# Patient Record
Sex: Female | Born: 1990 | Race: White | Hispanic: No | Marital: Married | Smoking: Never smoker
Health system: Southern US, Community
[De-identification: ages and names within clinical notes are randomized; demographics above are authoritative.]

## PROBLEM LIST (undated history)

## (undated) ENCOUNTER — Inpatient Hospital Stay: Payer: Self-pay

## (undated) DIAGNOSIS — F419 Anxiety disorder, unspecified: Secondary | ICD-10-CM

## (undated) DIAGNOSIS — D249 Benign neoplasm of unspecified breast: Secondary | ICD-10-CM

## (undated) HISTORY — DX: Benign neoplasm of unspecified breast: D24.9

---
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
## 2011-09-02 DIAGNOSIS — D249 Benign neoplasm of unspecified breast: Secondary | ICD-10-CM

## 2011-09-02 HISTORY — PX: BREAST BIOPSY: SHX20

## 2011-09-02 HISTORY — DX: Benign neoplasm of unspecified breast: D24.9

## 2012-11-10 ENCOUNTER — Encounter: Payer: Self-pay | Admitting: *Deleted

## 2012-11-27 ENCOUNTER — Encounter: Payer: Self-pay | Admitting: General Surgery

## 2013-01-20 ENCOUNTER — Ambulatory Visit: Payer: Self-pay | Admitting: General Surgery

## 2013-01-27 ENCOUNTER — Encounter: Payer: Self-pay | Admitting: *Deleted

## 2014-07-03 ENCOUNTER — Encounter: Payer: Self-pay | Admitting: *Deleted

## 2014-08-28 ENCOUNTER — Observation Stay: Payer: Self-pay

## 2014-08-28 LAB — PIH PROFILE
Anion Gap: 8 (ref 7–16)
BUN: 8 mg/dL (ref 7–18)
CALCIUM: 8.5 mg/dL (ref 8.5–10.1)
Chloride: 108 mmol/L — ABNORMAL HIGH (ref 98–107)
Co2: 22 mmol/L (ref 21–32)
Creatinine: 0.77 mg/dL (ref 0.60–1.30)
EGFR (African American): >60
EGFR (Non-African Amer.): >60
Glucose: 92 mg/dL (ref 65–99)
HCT: 33.2 % — ABNORMAL LOW (ref 35.0–47.0)
HGB: 11 g/dL — AB (ref 12.0–16.0)
MCH: 32.2 pg (ref 26.0–34.0)
MCHC: 33.2 g/dL (ref 32.0–36.0)
MCV: 97 fL (ref 80–100)
Osmolality: 274 (ref 275–301)
PLATELETS: 279 10*3/uL (ref 150–440)
POTASSIUM: 3.7 mmol/L (ref 3.5–5.1)
RBC: 3.41 10*6/uL — ABNORMAL LOW (ref 3.80–5.20)
RDW: 12.9 % (ref 11.5–14.5)
SGOT(AST): 17 U/L (ref 15–37)
Sodium: 138 mmol/L (ref 136–145)
Uric Acid: 4.1 mg/dL (ref 2.6–6.0)
WBC: 10.9 10*3/uL (ref 3.6–11.0)

## 2014-08-28 LAB — PROTEIN / CREATININE RATIO, URINE
CREATININE, URINE: 36.6 mg/dL (ref 30.0–125.0)
PROTEIN/CREAT. RATIO: 492 mg/g{creat} — AB (ref 0–200)
Protein, Random Urine: 18 mg/dL — ABNORMAL HIGH (ref 0–12)

## 2014-08-30 ENCOUNTER — Inpatient Hospital Stay: Payer: Self-pay | Admitting: Obstetrics & Gynecology

## 2014-08-30 LAB — PIH PROFILE
ANION GAP: 7 (ref 7–16)
BUN: 9 mg/dL (ref 7–18)
CHLORIDE: 108 mmol/L — AB (ref 98–107)
Calcium, Total: 8.9 mg/dL (ref 8.5–10.1)
Co2: 25 mmol/L (ref 21–32)
Creatinine: 0.78 mg/dL (ref 0.60–1.30)
EGFR (Non-African Amer.): >60
Glucose: 73 mg/dL (ref 65–99)
HCT: 33.7 % — ABNORMAL LOW (ref 35.0–47.0)
HGB: 11.2 g/dL — ABNORMAL LOW (ref 12.0–16.0)
MCH: 31.9 pg (ref 26.0–34.0)
MCHC: 33.2 g/dL (ref 32.0–36.0)
MCV: 96 fL (ref 80–100)
Osmolality: 277 (ref 275–301)
Platelet: 277 10*3/uL (ref 150–440)
Potassium: 4 mmol/L (ref 3.5–5.1)
RBC: 3.51 10*6/uL — AB (ref 3.80–5.20)
RDW: 13.1 % (ref 11.5–14.5)
SGOT(AST): 19 U/L (ref 15–37)
SODIUM: 140 mmol/L (ref 136–145)
URIC ACID: 4.1 mg/dL (ref 2.6–6.0)
WBC: 11.4 10*3/uL — AB (ref 3.6–11.0)

## 2014-08-30 LAB — PROTEIN / CREATININE RATIO, URINE
CREATININE, URINE: 58.7 mg/dL (ref 30.0–125.0)
PROTEIN, RANDOM URINE: 23 mg/dL — AB (ref 0–12)
Protein/Creat. Ratio: 392 mg/gCREAT — ABNORMAL HIGH (ref 0–200)

## 2014-09-01 DIAGNOSIS — O1400 Mild to moderate pre-eclampsia, unspecified trimester: Secondary | ICD-10-CM

## 2014-09-01 HISTORY — DX: Mild to moderate pre-eclampsia, unspecified trimester: O14.00

## 2014-09-02 LAB — HEMATOCRIT: HCT: 33 % — ABNORMAL LOW (ref 35.0–47.0)

## 2015-01-09 NOTE — H&P (Signed)
L&D Evaluation:  History Expanded:  HPI Pt is a 24 yo G1P0 at 49w2dGA with an EDC of 09/11/14 who presents to L&D for Induction of labor for preeclampsia without severe features. Pt reports +FM, and an occassional headache though none today, denies vb, lof or ctx. Her prenatal course is has been benign. She is O+, RNI, GBS positive, VI, flu and tdap up to date.  She has had several episodes of elevated BPs to the mild range. A recent 24-hour Urine total protein with > 3018m   Presents with other, HTN   Patient's Medical History No Chronic Illness   Patient's Surgical History left breast biopsy, wisdom teeth   Medications Pre Natal Vitamins   Allergies other, seasonal, corn pops cereal,   Social History none   Family History Non-Contributory   ROS:  ROS All systems were reviewed.  HEENT, CNS, GI, GU, Respiratory, CV, Renal and Musculoskeletal systems were found to be normal.   Exam:  Vital Signs stable  Afebrile, VSS (normotensive)   General no apparent distress   Mental Status clear   Chest clear   Heart normal sinus rhythm   Abdomen gravid, non-tender   Estimated Fetal Weight Average for gestational age   Back no CVAT   Edema no edema   Pelvic no external lesions, 1/30/-3   Mebranes Intact   FHT normal rate with no decels, 135, moderate variability, +accels, no decels   Ucx irregular   Skin dry, no lesions, no rashes   Lymph no lymphadenopathy   Impression:  Impression evaluation for PIH, 1) Intrauterine pregnancy at [age]w2dstational age, 2) preeclampsia without severe features   Plan:  Plan EFM/NST   Comments 1) Labor: Induction of labor w Cervadil  2) Fetus - category I tracing  3) PNL O Positive / ABSC negative / RNI - needs MMR / VZI / HIV neg / RPR NR / HBsAg neg / 1st trimester screen negative / CF screen negative /msAFP neg / 1-hr OGTT 99 / GBS positive  4) TDAP & flu vacc's given 07/05/14  5) Disposition - home postpartum  6)  Induction of labor: Pt has been fully informed of the pros and cons, risk/benefits continued observation with NST/AFI versus the risks of induction.  She understands that there is an increasing risk to the fetus and mother with preeclampsia.  She also understands that there are uncommon risks to induction, which include but are not limited to:  frequent and/or prolonged uterine contractions, fetal distress, uterine rupture and lack of success of induction.  These risks involve all methods of induction whether Pitocin or Misoprostol She also has been informed that if the induction is not successful a Cesarean Section may be necessary.   Follow Up Appointment need to schedule   Labs:  Lab Results:  Hepatic:  30-Dec-15 16:08   SGOT (AST) 19 (Result(s) reported on 30 Aug 2014 at 04:43PM.)  Routine BB:  30-Dec-15 16:08   ABO Group + Rh Type O Positive  Antibody Screen NEGATIVE (Result(s) reported on 30 Aug 2014 at 05:25PM.)  Routine Chem:  30-Dec-15 16:08   Uric Acid, Serum 4.1 (Result(s) reported on 30 Aug 2014 at 04:43PM.)  Glucose, Serum 73  BUN 9  Sodium, Serum 140  Potassium, Serum 4.0  Chloride, Serum  108  CO2, Serum 25  Osmolality (calc) 277  Anion Gap 7  Calcium (Total), Serum 8.9  eGFR (Non-African American) >60 (eGFR values <18m50mn/1.73 m2 may be an indication of chronic kidney disease (  CKD). Calculated eGFR, using the MRDR Study equation, is useful in  patients with stable renal function. The eGFR calculation will not be reliable in acutely ill patients when serum creatinine is changing rapidly. It is not useful in patients on dialysis. The eGFR calculation may not be applicable to patients at the low and high extremes of body sizes, pregnant women, and vegetarians.)  eGFR (African American) >60  Routine Hem:  30-Dec-15 16:08   WBC (CBC)  11.4  RBC (CBC)  3.51  Hemoglobin (CBC)  11.2  Hematocrit (CBC)  33.7  Platelet Count (CBC) 277 (Result(s) reported on 30 Aug 2014  at 04:43PM.)  MCV 96  MCH 31.9  MCHC 33.2  RDW 13.1   Electronic Signatures: Will Bonnet (MD)  (Signed 30-Dec-15 18:15)  Authored: L&D Evaluation, Labs   Last Updated: 30-Dec-15 18:15 by Will Bonnet (MD)

## 2015-01-09 NOTE — H&P (Signed)
L&D Evaluation:  History:  HPI Pt is a 24 yo G1P0 at [redacted] weeks GA with an EDC of 09/11/14 who presents to L&D after being sent over from the office with HTN of BPs with diastolics in the 33'A x2. Pt reports +FM, and an occassional headache, denies vb, lof or ctx. Her prenatal course is has been benign. She is O+, RNI, GBS positive, VI, flu and tdap up to date.   Presents with other, HTN   Patient's Medical History No Chronic Illness   Patient's Surgical History left breast biopsy, wisdom teeth   Medications Pre Natal Vitamins   Allergies other, seasonal, corn pops cereal,   Social History none   Family History Non-Contributory   ROS:  ROS All systems were reviewed.  HEENT, CNS, GI, GU, Respiratory, CV, Renal and Musculoskeletal systems were found to be normal.   Exam:  Vital Signs stable  120-133/ 77-89   General no apparent distress   Mental Status clear   Chest clear   Heart normal sinus rhythm   Abdomen gravid, non-tender   Pelvic no external lesions   Mebranes Intact   FHT normal rate with no decels, 130s, moderate variability, +accels, no decels   Ucx absent   Skin dry, no lesions, no rashes   Lymph no lymphadenopathy   Impression:  Impression evaluation for PIH, IUP at 38 weeks   Plan:  Plan EFM/NST, [place] panel   Follow Up Appointment need to schedule   Electronic Signatures: Louisa Second (CNM)  (Signed 28-Dec-15 14:35)  Authored: L&D Evaluation   Last Updated: 28-Dec-15 14:35 by Louisa Second (CNM)

## 2016-11-22 DIAGNOSIS — R0989 Other specified symptoms and signs involving the circulatory and respiratory systems: Secondary | ICD-10-CM | POA: Diagnosis not present

## 2016-11-22 DIAGNOSIS — S2341XA Sprain of ribs, initial encounter: Secondary | ICD-10-CM | POA: Diagnosis not present

## 2017-01-14 ENCOUNTER — Other Ambulatory Visit: Payer: Self-pay | Admitting: Obstetrics and Gynecology

## 2017-01-14 ENCOUNTER — Telehealth: Payer: Self-pay | Admitting: Obstetrics and Gynecology

## 2017-01-14 DIAGNOSIS — R635 Abnormal weight gain: Secondary | ICD-10-CM | POA: Insufficient documentation

## 2017-01-14 DIAGNOSIS — R5383 Other fatigue: Secondary | ICD-10-CM | POA: Insufficient documentation

## 2017-01-14 NOTE — Telephone Encounter (Signed)
Candace Higgins, This is the patient that I was talking to you about.   She would like to have labs before she comes in on June 11th to see you..  She is having Fatigue, weight gain, low sex drive.   She would like to have her thyroid checked.  Thank you

## 2017-01-14 NOTE — Telephone Encounter (Signed)
Orders in computer

## 2017-01-22 ENCOUNTER — Other Ambulatory Visit: Payer: 59

## 2017-01-22 DIAGNOSIS — R5383 Other fatigue: Secondary | ICD-10-CM | POA: Diagnosis not present

## 2017-01-22 DIAGNOSIS — R635 Abnormal weight gain: Secondary | ICD-10-CM

## 2017-01-23 LAB — COMPREHENSIVE METABOLIC PANEL
A/G RATIO: 1.7 (ref 1.2–2.2)
ALK PHOS: 65 IU/L (ref 39–117)
ALT: 11 IU/L (ref 0–32)
AST: 15 IU/L (ref 0–40)
Albumin: 4.5 g/dL (ref 3.5–5.5)
BUN/Creatinine Ratio: 12 (ref 9–23)
BUN: 10 mg/dL (ref 6–20)
Bilirubin Total: 0.9 mg/dL (ref 0.0–1.2)
CHLORIDE: 101 mmol/L (ref 96–106)
CO2: 24 mmol/L (ref 18–29)
Calcium: 9.5 mg/dL (ref 8.7–10.2)
Creatinine, Ser: 0.83 mg/dL (ref 0.57–1.00)
GFR calc Af Amer: 113 mL/min/{1.73_m2} (ref >59)
GFR calc non Af Amer: 98 mL/min/{1.73_m2} (ref >59)
Globulin, Total: 2.7 g/dL (ref 1.5–4.5)
Glucose: 82 mg/dL (ref 65–99)
POTASSIUM: 3.9 mmol/L (ref 3.5–5.2)
SODIUM: 137 mmol/L (ref 134–144)
Total Protein: 7.2 g/dL (ref 6.0–8.5)

## 2017-01-23 LAB — TSH+FREE T4
Free T4: 1.14 ng/dL (ref 0.82–1.77)
TSH: 1.45 u[IU]/mL (ref 0.450–4.500)

## 2017-02-09 ENCOUNTER — Ambulatory Visit: Payer: Self-pay | Admitting: Obstetrics and Gynecology

## 2017-03-24 ENCOUNTER — Other Ambulatory Visit (INDEPENDENT_AMBULATORY_CARE_PROVIDER_SITE_OTHER): Payer: 59

## 2017-03-24 ENCOUNTER — Ambulatory Visit (INDEPENDENT_AMBULATORY_CARE_PROVIDER_SITE_OTHER): Payer: 59 | Admitting: Obstetrics and Gynecology

## 2017-03-24 ENCOUNTER — Encounter: Payer: Self-pay | Admitting: Obstetrics and Gynecology

## 2017-03-24 VITALS — BP 118/78 | Ht 66.0 in | Wt 160.0 lb

## 2017-03-24 DIAGNOSIS — R102 Pelvic and perineal pain: Secondary | ICD-10-CM

## 2017-03-24 DIAGNOSIS — T8332XA Displacement of intrauterine contraceptive device, initial encounter: Secondary | ICD-10-CM

## 2017-03-24 DIAGNOSIS — N9419 Other specified dyspareunia: Secondary | ICD-10-CM | POA: Diagnosis not present

## 2017-03-24 DIAGNOSIS — N3001 Acute cystitis with hematuria: Secondary | ICD-10-CM | POA: Diagnosis not present

## 2017-03-24 LAB — POCT URINALYSIS DIPSTICK
Bilirubin, UA: NEGATIVE
Glucose, UA: NEGATIVE
KETONES UA: NEGATIVE
NITRITE UA: NEGATIVE
PROTEIN UA: NEGATIVE
Spec Grav, UA: 1.02 (ref 1.010–1.025)
UROBILINOGEN UA: NEGATIVE U/dL
pH, UA: 6 (ref 5.0–8.0)

## 2017-03-24 MED ORDER — CIPROFLOXACIN HCL 500 MG PO TABS: 500.0000 mg | ORAL_TABLET | Freq: Two times a day (BID) | ORAL | 0 refills | Status: DC

## 2017-03-24 NOTE — Progress Notes (Addendum)
Chief Complaint  Patient presents with  . IUD check  . Follow-up  . Pelvic Pain    HPI:      [name] is a 26 y.o. G1P1001 who LMP was No LMP recorded. Patient is not currently having periods (Reason: IUD)., presents today for cramping, pelvic pain and nausea for the past 2-3 days. She also noticed fevers/chills yesterday, LT LBP, dysuria, and vaginal bleeding yesterday, resolved today. She took advil last night with a little improvement, but she hasn't had any relief today with NSAIDs. She has a Mirena, placed 3/16. She notices postcoital dark red spotting after sex for the past month, but has had dyspareunia for the past yr with IUD. No increased d/c, irritation, odor recently. No urin frequency/urgency/hematuria. She does not have a period with IUD, no spotting/cramping usually.   She tried to feel her IUD strings last night but couldn't find them. She had a neg UPT yesterday.  Patient Active Problem List   Diagnosis Date Noted  . Fatigue 01/14/2017  . Weight gain 01/14/2017    Family History  Problem Relation Age of Onset  . Hypertension Father   . Hypertension Paternal Grandfather     Social History   Social History  . Marital status: Single    Spouse name: N/A  . Number of children: N/A  . Years of education: N/A   Occupational History  . Not on file.   Social History Main Topics  . Smoking status: Never Smoker  . Smokeless tobacco: Never Used  . Alcohol use No  . Drug use: No  . Sexual activity: Yes    Birth control/ protection: IUD   Other Topics Concern  . Not on file   Social History Narrative  . No narrative on file     Current Outpatient Prescriptions:  .  levonorgestrel (MIRENA) 20 MCG/24HR IUD, 1 each by Intrauterine route once., Disp: , Rfl:  .  ciprofloxacin (CIPRO) 500 MG tablet, Take 1 tablet (500 mg total) by mouth 2 (two) times daily., Disp: 6 tablet, Rfl: 0  Review of Systems  Constitutional: Positive for fatigue. Negative  for fever.  Gastrointestinal: Positive for nausea. Negative for blood in stool, constipation, diarrhea and vomiting.  Genitourinary: Positive for dysuria, pelvic pain and vaginal bleeding. Negative for dyspareunia, flank pain, frequency, hematuria, urgency, vaginal discharge and vaginal pain.  Musculoskeletal: Positive for back pain.  Skin: Negative for rash.     OBJECTIVE:   Vitals:  BP 118/78   Ht 5\' 6"  (1.676 m)   Wt 160 lb (72.6 kg)   BMI 25.82 kg/m   Physical Exam  Constitutional: She is oriented to person, place, and time and well-developed, well-nourished, and in no distress.  Abdominal: Soft. Normal appearance. There is tenderness in the right lower quadrant, suprapubic area and left lower quadrant. There is CVA tenderness. There is no rebound and no guarding.  Genitourinary: Vulva normal. Uterus is tender. Uterus is not enlarged.  Cervix is not fixed. Cervix exhibits tenderness. Cervix exhibits no motion tenderness and no lesion. Right adnexum displays tenderness. Right adnexum displays no deviation and no mass. Left adnexum displays tenderness. Left adnexum displays no deviation and no mass. Vagina exhibits no lesion. Vaginal discharge found.  Genitourinary Comments: CX: IUD STRINGS NOT VISIBLE NO BLEEDING IN VAGINA/CX  Neurological: She is alert and oriented to person, place, and time.  Psychiatric: Affect and judgment normal.  Vitals reviewed.   Results: Results for orders placed or performed in visit  on 03/24/17 (from the past 24 hour(s))  POCT Urinalysis Dipstick     Status: Abnormal   Collection Time: 03/24/17 11:56 AM  Result Value Ref Range   Color, UA yellow    Clarity, UA     Glucose, UA neg    Bilirubin, UA neg    Ketones, UA neg    Spec Grav, UA 1.020 1.010 - 1.025   Blood, UA small    pH, UA 6.0 5.0 - 8.0   Protein, UA neg    Urobilinogen, UA negative 0.2 or 1.0 E.U./dL   Nitrite, UA neg    Leukocytes, UA Small (1+) (A) Negative    GYN  U/S-->EM=1.65 MM; IUD IN PLACE; BILAT FOLLICLES; NO FF IN CDS   Assessment/Plan: Pelvic pain - Neg GYN u/s. IUD in place. Question due to UTI. Rx cipro. If persists, could be related to IUD--pt may want removed anyway due to dyspareunia. F/u wtih MD prn. - Plan: US Transvaginal Non-OB, POCT Urinalysis Dipstick  Intrauterine contraceptive device threads lost, initial encounter - Plan: US Transvaginal Non-OB  Acute cystitis with hematuria - Rx cipro. Check C&S. F/u prn. - Plan: ciprofloxacin (CIPRO) 500 MG tablet, Urine Culture  Dyspareunia due to medical condition in female patient - Even though IUD in correct place, could be related to IUD. Pt to f/u with MD for removal if desires.   Return if symptoms worsen or fail to improve.  Becky Berberian B. Jonavin Seder, PA-C 03/24/2017 1:57 PM

## 2017-03-25 ENCOUNTER — Other Ambulatory Visit: Payer: 59

## 2017-03-28 ENCOUNTER — Emergency Department
Admission: EM | Admit: 2017-03-28 | Discharge: 2017-03-28 | Disposition: A | Discharge status: Home / Self Care | Payer: 59 | Attending: Emergency Medicine | Admitting: Emergency Medicine

## 2017-03-28 ENCOUNTER — Emergency Department: Payer: 59

## 2017-03-28 ENCOUNTER — Encounter: Payer: Self-pay | Admitting: Emergency Medicine

## 2017-03-28 DIAGNOSIS — R0600 Dyspnea, unspecified: Secondary | ICD-10-CM | POA: Diagnosis not present

## 2017-03-28 DIAGNOSIS — R079 Chest pain, unspecified: Secondary | ICD-10-CM | POA: Diagnosis present

## 2017-03-28 DIAGNOSIS — K2900 Acute gastritis without bleeding: Secondary | ICD-10-CM | POA: Diagnosis not present

## 2017-03-28 DIAGNOSIS — Z79899 Other long term (current) drug therapy: Secondary | ICD-10-CM | POA: Insufficient documentation

## 2017-03-28 DIAGNOSIS — R0789 Other chest pain: Secondary | ICD-10-CM | POA: Diagnosis not present

## 2017-03-28 LAB — HEPATIC FUNCTION PANEL
ALT: 43 U/L (ref 14–54)
AST: 32 U/L (ref 15–41)
Albumin: 4.5 g/dL (ref 3.5–5.0)
Alkaline Phosphatase: 70 U/L (ref 38–126)
BILIRUBIN DIRECT: 0.2 mg/dL (ref 0.1–0.5)
BILIRUBIN INDIRECT: 1.1 mg/dL — AB (ref 0.3–0.9)
TOTAL PROTEIN: 8.1 g/dL (ref 6.5–8.1)
Total Bilirubin: 1.3 mg/dL — ABNORMAL HIGH (ref 0.3–1.2)

## 2017-03-28 LAB — CBC WITH DIFFERENTIAL/PLATELET
BASOS ABS: 0 10*3/uL (ref 0–0.1)
BASOS PCT: 1 %
EOS ABS: 0 10*3/uL (ref 0–0.7)
Eosinophils Relative: 0 %
HEMATOCRIT: 41.4 % (ref 35.0–47.0)
HEMOGLOBIN: 14.5 g/dL (ref 12.0–16.0)
Lymphocytes Relative: 23 %
Lymphs Abs: 1.6 10*3/uL (ref 1.0–3.6)
MCH: 32.3 pg (ref 26.0–34.0)
MCHC: 35 g/dL (ref 32.0–36.0)
MCV: 92.5 fL (ref 80.0–100.0)
Monocytes Absolute: 0.7 10*3/uL (ref 0.2–0.9)
Monocytes Relative: 10 %
NEUTROS ABS: 4.8 10*3/uL (ref 1.4–6.5)
NEUTROS PCT: 66 %
Platelets: 200 10*3/uL (ref 150–440)
RBC: 4.48 MIL/uL (ref 3.80–5.20)
RDW: 12.6 % (ref 11.5–14.5)
WBC: 7.2 10*3/uL (ref 3.6–11.0)

## 2017-03-28 LAB — BASIC METABOLIC PANEL
ANION GAP: 9 (ref 5–15)
BUN: 12 mg/dL (ref 6–20)
CALCIUM: 9.5 mg/dL (ref 8.9–10.3)
CHLORIDE: 103 mmol/L (ref 101–111)
CO2: 25 mmol/L (ref 22–32)
CREATININE: 0.97 mg/dL (ref 0.44–1.00)
GFR calc non Af Amer: >60 mL/min (ref >60)
Glucose, Bld: 118 mg/dL — ABNORMAL HIGH (ref 65–99)
Potassium: 4 mmol/L (ref 3.5–5.1)
SODIUM: 137 mmol/L (ref 135–145)

## 2017-03-28 LAB — TROPONIN I

## 2017-03-28 LAB — FIBRIN DERIVATIVES D-DIMER (ARMC ONLY): FIBRIN DERIVATIVES D-DIMER (ARMC): 476.63 (ref 0.00–499.00)

## 2017-03-28 LAB — LIPASE, BLOOD: Lipase: 21 U/L (ref 11–51)

## 2017-03-28 MED ORDER — GI COCKTAIL ~~LOC~~
30.0000 mL | Freq: Once | ORAL | Status: AC
  Administered 2017-03-28: 30 mL via ORAL
  Filled 2017-03-28: qty 30

## 2017-03-28 MED ORDER — FAMOTIDINE IN NACL 20-0.9 MG/50ML-% IV SOLN
20.0000 mg | Freq: Once | INTRAVENOUS | Status: AC
  Administered 2017-03-28: 20 mg via INTRAVENOUS
  Filled 2017-03-28: qty 50

## 2017-03-28 MED ORDER — SODIUM CHLORIDE 0.9 % IV BOLUS (SEPSIS)
1000.0000 mL | Freq: Once | INTRAVENOUS | Status: AC
  Administered 2017-03-28: 1000 mL via INTRAVENOUS

## 2017-03-28 MED ORDER — SUCRALFATE 1 G PO TABS: 1.0000 g | ORAL_TABLET | Freq: Four times a day (QID) | ORAL | 0 refills | Status: DC

## 2017-03-28 MED ORDER — OMEPRAZOLE 40 MG PO CPDR: 40.0000 mg | DELAYED_RELEASE_CAPSULE | Freq: Every day | ORAL | 0 refills | Status: DC

## 2017-03-28 MED ORDER — ALBUTEROL SULFATE (2.5 MG/3ML) 0.083% IN NEBU
5.0000 mg | INHALATION_SOLUTION | Freq: Once | RESPIRATORY_TRACT | Status: DC
  Filled 2017-03-28: qty 6

## 2017-03-28 NOTE — ED Triage Notes (Signed)
Pt arrives ambulatory to triage with c/o SOB. Pt states that she had what she believed was acid reflux yesterday and it is still unrelieved. Pt reports that she is now having difficulty catching her breath. Pt is in NAD at this time.

## 2017-03-28 NOTE — ED Provider Notes (Signed)
[facility] Emergency Department Provider Note  ____________________________________________   First MD Initiated Contact with Patient 03/28/17 [phone]     (approximate)  I have reviewed the triage vital signs and the nursing notes.   HISTORY  Chief Complaint Respiratory Distress   HPI Candace Higgins is a 26 y.o. female without any chronic medical problems was presenting to the emergency department today with upper abdominal as well as lower chest pain for the past 24 hours. Says the pain is 9 out of 10 and is a burning and stabbing pain. The patient says that it is worse when she lays down. She took Tums before going to bed which improved the pain with that she woke up in the middle the night with the air out of 10 pain which made her alarmed to come to the hospital. She denies any hormone supplementation except for her IUD. No history of blood clots in the family. Denies any productive cough.   Past Medical History:  Diagnosis Date  . Benign neoplasm of breast 2013    Patient Active Problem List   Diagnosis Date Noted  . Fatigue 01/14/2017  . Weight gain 01/14/2017    Past Surgical History:  Procedure Laterality Date  . BREAST BIOPSY  2013   left  . [place] EXTRACTION  2009    Prior to Admission medications   Medication Sig Start Date End Date Taking? Authorizing Provider  ciprofloxacin (CIPRO) 500 MG tablet Take 1 tablet (500 mg total) by mouth 2 (two) times daily. 3/32/95   Copland, Deirdre Evener, PA-C  levonorgestrel (MIRENA) 20 MCG/24HR IUD 1 each by Intrauterine route once.    [provider]    Allergies Patient has no known allergies.  Family History  Problem Relation Age of Onset  . Hypertension Father   . Hypertension Paternal Grandfather     Social History Social History  Substance Use Topics  . Smoking status: Never Smoker  . Smokeless tobacco: Never Used  . Alcohol use No    Review of  Systems  Constitutional: No fever/chills Eyes: No visual changes. ENT: No sore throat. Cardiovascular: as above Respiratory: Denies shortness of breath. Gastrointestinal: No abdominal pain.  No nausea, no vomiting.  No diarrhea.  No constipation. Genitourinary: Negative for dysuria. Musculoskeletal: Negative for back pain. Skin: Negative for rash. Neurological: Negative for headaches, focal weakness or numbness.   ____________________________________________   PHYSICAL EXAM:  VITAL SIGNS: ED Triage Vitals  Enc Vitals Group     BP 03/28/17 0536 136/78     Pulse Rate 03/28/17 0536 (!) 110     Resp 03/28/17 0536 12     Temp 03/28/17 0536 98.4 F (36.9 C)     Temp Source 03/28/17 0536 Oral     SpO2 03/28/17 0536 98 %     Weight 03/28/17 0532 160 lb (72.6 kg)     Height 03/28/17 0532 5\' 6"  (1.676 m)     Head Circumference --      Peak Flow --      Pain Score 03/28/17 0532 8     Pain Loc --      Pain Edu? --      Excl. in [place]? --     Constitutional: Alert and oriented. Well appearing and in no acute distress. Eyes: Conjunctivae are normal.  Head: Atraumatic. Nose: No congestion/rhinnorhea. Mouth/Throat: Mucous membranes are moist.  Neck: No stridor.   Cardiovascular: Normal rate, regular rhythm. Grossly normal heart sounds.  Good  peripheral circulation. Respiratory: Normal respiratory effort.  No retractions. Lungs CTAB. Gastrointestinal: Soft With epigastric tenderness palpation. No distention.  Musculoskeletal: No lower extremity tenderness nor edema.  No joint effusions. Neurologic:  Normal speech and language. No gross focal neurologic deficits are appreciated. Skin:  Skin is warm, dry and intact. No rash noted. Psychiatric: Mood and affect are normal. Speech and behavior are normal.  ____________________________________________   LABS (all labs ordered are listed, but only abnormal results are displayed)  Labs Reviewed  BASIC METABOLIC PANEL - Abnormal;  Notable for the following:       Result Value   Glucose, Bld 118 (*)    All other components within normal limits  HEPATIC FUNCTION PANEL - Abnormal; Notable for the following:    Total Bilirubin 1.3 (*)    Indirect Bilirubin 1.1 (*)    All other components within normal limits  CBC WITH DIFFERENTIAL/PLATELET  TROPONIN I  FIBRIN DERIVATIVES D-DIMER ([facility] ONLY)  LIPASE, BLOOD   ____________________________________________  EKG  ED ECG REPORT I, Schaevitz,  Youlanda Roys, the attending physician, personally viewed and interpreted this ECG.   Date: 03/28/2017  EKG Time: 0537  Rate: 97  Rhythm: normal sinus rhythm  Axis: Normal  Intervals:none  ST&T Change: No ST segment elevation or depression. No abnormal T-wave inversion.  ____________________________________________  RADIOLOGY  No acute cardiopulmonary disease on chest x-ray ____________________________________________   PROCEDURES  Procedure(s) performed:   Procedures  Critical Care performed:   ____________________________________________   INITIAL IMPRESSION / ASSESSMENT AND PLAN / ED COURSE  Pertinent labs & imaging results that were available during my care of the patient were reviewed by me and considered in my medical decision making (see chart for details).   ----------------------------------------- 6:55 AM on 03/28/2017 -----------------------------------------  Patient says that the GI cocktail has relieved all of the burning in her chest. However, she is still with a discomfort which feels like a heaviness between her breasts. She says that it hurts when she pushes on that area as well. Very reassuring workup including a negative d-dimer and troponin. Heart rate now is down in the 80s. Patient appears noticeably calmer. Very low risk for ACS as well as PE. GI cocktail caused significant improvement. Discussed likely diagnosis of gastritis and reflux disease with the patient. She'll be discharged with  omeprazole as well as sucralfate. She'll be following up in the office. She is understanding of the diagnosis as well as the treatment plan and willing to comply.      ____________________________________________   FINAL CLINICAL IMPRESSION(S) / ED DIAGNOSES  Gastritis. Chest pain.    NEW MEDICATIONS STARTED DURING THIS VISIT:  New Prescriptions   No medications on file     Note:  This document was prepared using Dragon voice recognition software and may include unintentional dictation errors.     Orbie Pyo, MD 03/28/17 [phone]

## 2017-04-03 ENCOUNTER — Ambulatory Visit (INDEPENDENT_AMBULATORY_CARE_PROVIDER_SITE_OTHER): Payer: 59 | Admitting: Obstetrics and Gynecology

## 2017-04-03 VITALS — BP 122/70 | Ht 66.0 in | Wt 158.0 lb

## 2017-04-03 DIAGNOSIS — Z30432 Encounter for removal of intrauterine contraceptive device: Secondary | ICD-10-CM

## 2017-04-03 DIAGNOSIS — Z308 Encounter for other contraceptive management: Secondary | ICD-10-CM

## 2017-04-03 MED ORDER — NORGESTIM-ETH ESTRAD TRIPHASIC 0.18/0.215/0.25 MG-25 MCG PO TABS: 1.0000 | ORAL_TABLET | Freq: Every day | ORAL | 4 refills | Status: DC

## 2017-04-03 NOTE — Progress Notes (Signed)
   IUD Removal  Patient identified, informed consent performed, consent signed.  Patient was in the dorsal lithotomy position, normal external genitalia was noted.  A speculum was placed in the patient's vagina, normal discharge was noted, no lesions. The cervix was visualized, no lesions, no abnormal discharge.  The strings of the IUD were grasped and pulled using ring forceps. The IUD was removed in its entirety. Patient tolerated the procedure well.    Patient will use combined OCPs for contraception.  Routine preventative health maintenance measures emphasized.  Prentice Docker, MD 04/03/2017 5:07 PM

## 2017-06-16 ENCOUNTER — Ambulatory Visit (INDEPENDENT_AMBULATORY_CARE_PROVIDER_SITE_OTHER): Payer: 59

## 2017-06-16 ENCOUNTER — Ambulatory Visit (INDEPENDENT_AMBULATORY_CARE_PROVIDER_SITE_OTHER): Payer: 59 | Admitting: Obstetrics & Gynecology

## 2017-06-16 ENCOUNTER — Encounter: Payer: Self-pay | Admitting: Obstetrics & Gynecology

## 2017-06-16 VITALS — BP 100/60 | Wt 154.0 lb

## 2017-06-16 DIAGNOSIS — Z124 Encounter for screening for malignant neoplasm of cervix: Secondary | ICD-10-CM

## 2017-06-16 DIAGNOSIS — N915 Oligomenorrhea, unspecified: Secondary | ICD-10-CM

## 2017-06-16 DIAGNOSIS — Z3201 Encounter for pregnancy test, result positive: Secondary | ICD-10-CM

## 2017-06-16 LAB — POCT URINE PREGNANCY: PREG TEST UR: POSITIVE — AB

## 2017-06-16 NOTE — Addendum Note (Signed)
Addended by: Quintella Baton D on: 06/16/2017 10:46 AM   Modules accepted: Orders

## 2017-06-16 NOTE — Progress Notes (Signed)
06/16/2017   Chief Complaint: Missed period  Transfer of Care Patient: no  History of Present Illness: Ms. Strahm is a 26 y.o. G2P1001 Unknown based on No LMP recorded. Patient is pregnant. with an Estimated Date of Delivery: None noted., with the above CC. IUD removed and no period since (04/03/17)  Her periods were: absent She was using oral contraceptives (estrogen/progesterone) when she conceived. No period yet on pills.  Stopped when had pos uPT. She has Positive signs or symptoms of breast T of pregnancy. She has Negative signs or symptoms of miscarriage or preterm labor She identifies Negative Zika risk factors for her and her partner On any different medications around the time she conceived/early pregnancy: No  History of varicella: Yes   ROS: A 12-point review of systems was performed and negative, except as stated in the above HPI.  OBGYN History: As per HPI. OB History  Gravida Para Term Preterm AB Living  2 1 1     1   SAB TAB Ectopic Multiple Live Births          1    # Outcome Date GA Lbr Len/2nd Weight Sex Delivery Anes PTL Lv  2 Current           1 Term             Obstetric Comments  First menstrual period age 46    Any issues with any prior pregnancies: PRECLAMPSIA Any prior children are healthy, doing well, without any problems or issues: yes History of pap smears: Yes. Last pap smear 2017. Abnormal: no  History of STIs: No   Past Medical History: Past Medical History:  Diagnosis Date  . Benign neoplasm of breast 2013    Past Surgical History: Past Surgical History:  Procedure Laterality Date  . BREAST BIOPSY  2013   left  . WISDOM TOOTH EXTRACTION  2009    Family History:  Family History  Problem Relation Age of Onset  . Hypertension Father   . Hypertension Paternal Grandfather    She denies any female cancers, bleeding or blood clotting disorders.  She denies any history of mental retardation, birth defects or genetic disorders in her or  the FOB's history  Social History:  Social History   Social History  . Marital status: Single    Spouse name: N/A  . Number of children: N/A  . Years of education: N/A   Occupational History  . Not on file.   Social History Main Topics  . Smoking status: Never Smoker  . Smokeless tobacco: Never Used  . Alcohol use No  . Drug use: No  . Sexual activity: Yes    Birth control/ protection: IUD   Other Topics Concern  . Not on file   Social History Narrative  . No narrative on file  Any pets in the household: no  Allergy: No Known Allergies  Current Outpatient Medications:  Current Outpatient Prescriptions:  .  omeprazole (PRILOSEC) 40 MG capsule, Take 1 capsule (40 mg total) by mouth daily. (Patient not taking: Reported on 06/16/2017), Disp: 30 capsule, Rfl: 0  Physical Exam:   BP 100/60   Wt 154 lb (69.9 kg)   BMI 24.86 kg/m  Body mass index is 24.86 kg/m. Constitutional: Well nourished, well developed female in no acute distress.  Neck:  Supple, normal appearance, and no thyromegaly  Cardiovascular: S1, S2 normal, no murmur, rub or gallop, regular rate and rhythm Respiratory:  Clear to auscultation bilateral. Normal respiratory effort Abdomen:  positive bowel sounds and no masses, hernias; diffusely non tender to palpation, non distended Breasts: breasts appear normal, no suspicious masses, no skin or nipple changes or axillary nodes. Neuro/Psych:  Normal mood and affect.  Skin:  Warm and dry.  Lymphatic:  No inguinal lymphadenopathy.   Pelvic exam: is not limited by body habitus EGBUS: within normal limits, Vagina: within normal limits and with no blood in the vault, Cervix: normal appearing cervix without discharge or lesions, closed/long/high, Uterus:  enlarged: 6 weeks, and Adnexa:  normal adnexa  Assessment: Ms. Brittle is a 26 y.o. [id number] with uncertain EGA based on oligomenorrhea at time of conception, for prenatal care. H/o Preclamspia  Plan:  1) Avoid  alcoholic beverages. 2) Patient encouraged not to smoke.  3) Discontinue the use of all non-medicinal drugs and chemicals.  4) Take prenatal vitamins daily.  5) Seatbelt use advised 6) Nutrition, food safety (fish, cheese advisories, and high nitrite foods) and exercise discussed. 7) Hospital and practice style delivering at [facility] discussed  8) Patient is asked about travel to areas at risk for the [place] virus, and counseled to avoid travel and exposure to mosquitoes or sexual partners who may have themselves been exposed to the virus. Testing is discussed, and will be ordered as appropriate.  9) Childbirth classes at [facility] advised 10) Genetic Screening, such as with 1st Trimester Screening, cell free fetal DNA, AFP testing, and Ultrasound, as well as with amniocentesis and CVS as appropriate, is discussed with patient. She plans to discuss w fiancee about genetic testing this pregnancy. 11) [place] today, with labs as well 12) baby ASA daily, baseline preeclampsia labs  Problem list reviewed and updated.  Barnett Applebaum, MD, [facility], [facility] 06/16/2017  9:23 AM  Addendum: Review of ULTRASOUND.    I have personally reviewed images and report of recent ultrasound done at [facility].    Plan of management to be discussed with patient.  Barnett Applebaum, MD, [facility], [facility] 06/16/2017  10:29 AM

## 2017-06-16 NOTE — Patient Instructions (Signed)

## 2017-06-17 LAB — RPR+RH+ABO+RUB AB+AB SCR+CB...
ANTIBODY SCREEN: NEGATIVE
HIV Screen 4th Generation wRfx: NONREACTIVE
Hematocrit: 40.1 % (ref 34.0–46.6)
Hemoglobin: 13.4 g/dL (ref 11.1–15.9)
Hepatitis B Surface Ag: NEGATIVE
MCH: 32.1 pg (ref 26.6–33.0)
MCHC: 33.4 g/dL (ref 31.5–35.7)
MCV: 96 fL (ref 79–97)
PLATELETS: 254 10*3/uL (ref 150–379)
RBC: 4.18 x10E6/uL (ref 3.77–5.28)
RDW: 13.4 % (ref 12.3–15.4)
RPR Ser Ql: NONREACTIVE
Rh Factor: POSITIVE
VARICELLA: 798 {index} (ref >165)
WBC: 6.5 10*3/uL (ref 3.4–10.8)

## 2017-06-17 LAB — CMP AND LIVER
ALBUMIN: 4.2 g/dL (ref 3.5–5.5)
ALT: 28 IU/L (ref 0–32)
AST: 25 IU/L (ref 0–40)
Alkaline Phosphatase: 56 IU/L (ref 39–117)
BUN: 8 mg/dL (ref 6–20)
Bilirubin Total: 0.9 mg/dL (ref 0.0–1.2)
Bilirubin, Direct: 0.22 mg/dL (ref 0.00–0.40)
CO2: 18 mmol/L — ABNORMAL LOW (ref 20–29)
CREATININE: 0.78 mg/dL (ref 0.57–1.00)
Calcium: 9 mg/dL (ref 8.7–10.2)
Chloride: 100 mmol/L (ref 96–106)
GFR calc Af Amer: 120 mL/min/{1.73_m2} (ref >59)
GFR, EST NON AFRICAN AMERICAN: 105 mL/min/{1.73_m2} (ref >59)
GLUCOSE: 77 mg/dL (ref 65–99)
POTASSIUM: 4.6 mmol/L (ref 3.5–5.2)
Sodium: 135 mmol/L (ref 134–144)
Total Protein: 6.9 g/dL (ref 6.0–8.5)

## 2017-06-17 LAB — IGP,CTNGTV,RFX APTIMA HPV ASCU
Chlamydia, Nuc. Acid Amp: NEGATIVE
Gonococcus, Nuc. Acid Amp: NEGATIVE
PAP Smear Comment: 0
Trich vag by NAA: NEGATIVE

## 2017-06-17 LAB — PROTEIN / CREATININE RATIO, URINE
CREATININE, UR: 220.7 mg/dL
PROTEIN UR: 13.9 mg/dL
PROTEIN/CREAT RATIO: 63 mg/g{creat} (ref 0–200)

## 2017-07-14 ENCOUNTER — Ambulatory Visit (INDEPENDENT_AMBULATORY_CARE_PROVIDER_SITE_OTHER): Payer: 59 | Admitting: Obstetrics and Gynecology

## 2017-07-14 VITALS — BP 110/68 | Wt 157.0 lb

## 2017-07-14 DIAGNOSIS — O09299 Supervision of pregnancy with other poor reproductive or obstetric history, unspecified trimester: Secondary | ICD-10-CM

## 2017-07-14 DIAGNOSIS — Z1379 Encounter for other screening for genetic and chromosomal anomalies: Secondary | ICD-10-CM

## 2017-07-14 DIAGNOSIS — Z3480 Encounter for supervision of other normal pregnancy, unspecified trimester: Secondary | ICD-10-CM

## 2017-07-14 DIAGNOSIS — Z3A10 10 weeks gestation of pregnancy: Secondary | ICD-10-CM

## 2017-07-14 NOTE — Progress Notes (Signed)
    Routine Prenatal Care Visit  Subjective  Candace Higgins is a 26 y.o. G2P1001 at [redacted]w[redacted]d being seen today for ongoing prenatal care.  She is currently monitored for the following issues for this low-risk pregnancy and has Supervision of other normal pregnancy, antepartum and Hx of preeclampsia, prior pregnancy, currently pregnant on their problem list.  ----------------------------------------------------------------------------------- Patient reports no complaints.    . Vag. Bleeding: None.  Movement: Absent. Denies leaking of fluid.  ----------------------------------------------------------------------------------- The following portions of the patient's history were reviewed and updated as appropriate: allergies, current medications, past family history, past medical history, past social history, past surgical history and problem list. Problem list updated.   Objective  Blood pressure 110/68, weight 157 lb (71.2 kg). Pregravid weight Pregravid weight not on file Total Weight Gain Not found.   Urinalysis:      Fetal Status: Fetal Heart Rate (bpm): 170   Movement: Absent     General:  Alert, oriented and cooperative. Patient is in no acute distress.  Skin: Skin is warm and dry. No rash noted.   Cardiovascular: Normal heart rate noted  Respiratory: Normal respiratory effort, no problems with respiration noted  Abdomen: Soft, gravid, appropriate for gestational age. Pain/Pressure: Absent     Pelvic:  Cervical exam deferred        Extremities: Normal range of motion.     ental Status: Normal mood and affect. Normal behavior. Normal judgment and thought content.     Assessment   26 y.o. G2P1001 at [redacted]w[redacted]d by  02/04/2018, by Ultrasound presenting for routine prenatal visit  Plan   pregnancy 2 Problems (from 05/12/17 to present)    No problems associated with this episode.       Preterm labor symptoms and general obstetric precautions including but not limited to vaginal bleeding,  contractions, leaking of fluid and fetal movement were reviewed in detail with the patient. Please refer to After Visit Summary for other counseling recommendations.   Return in about 2 weeks (around 07/28/2017) for ROB and 1st trimester screen.

## 2017-07-14 NOTE — Progress Notes (Signed)
ROB

## 2017-07-29 ENCOUNTER — Encounter: Payer: Self-pay | Admitting: Maternal Newborn

## 2017-07-29 ENCOUNTER — Ambulatory Visit: Payer: 59 | Admitting: Maternal Newborn

## 2017-07-29 ENCOUNTER — Ambulatory Visit (INDEPENDENT_AMBULATORY_CARE_PROVIDER_SITE_OTHER): Payer: 59

## 2017-07-29 VITALS — BP 90/60 | Wt 152.0 lb

## 2017-07-29 DIAGNOSIS — Z3480 Encounter for supervision of other normal pregnancy, unspecified trimester: Secondary | ICD-10-CM

## 2017-07-29 DIAGNOSIS — O09299 Supervision of pregnancy with other poor reproductive or obstetric history, unspecified trimester: Secondary | ICD-10-CM | POA: Diagnosis not present

## 2017-07-29 DIAGNOSIS — Z1379 Encounter for other screening for genetic and chromosomal anomalies: Secondary | ICD-10-CM

## 2017-07-29 DIAGNOSIS — Z3A10 10 weeks gestation of pregnancy: Secondary | ICD-10-CM

## 2017-07-29 NOTE — Patient Instructions (Signed)

## 2017-07-29 NOTE — Progress Notes (Signed)
Routine Prenatal Care Visit  Subjective  Candace Higgins is a 26 y.o. G2P1001 at [redacted]w[redacted]d being seen today for ongoing prenatal care.  She is currently monitored for the following issues for this low-risk pregnancy and has Supervision of other normal pregnancy, antepartum and Hx of preeclampsia, prior pregnancy, currently pregnant on her problem list.  ----------------------------------------------------------------------------------- Patient reports backache and decreased appetite.   Vag. Bleeding: None. Denies leaking of fluid.  ----------------------------------------------------------------------------------- The following portions of the patient's history were reviewed and updated as appropriate: allergies, current medications, past family history, past medical history, past social history, past surgical history and problem list. Problem list updated.   Objective  Blood pressure 90/60, weight 152 lb (68.9 kg). Pregravid weight Pregravid weight not on file Total Weight Gain Not found. Urinalysis: Urine Protein: Negative Urine Glucose: Negative  Fetal Status: Fetal Heart Rate (bpm): 162         General:  Alert, oriented and cooperative. Patient is in no acute distress.  Skin: Skin is warm and dry. No rash noted.   Cardiovascular: Normal heart rate noted  Respiratory: Normal respiratory effort, no problems with respiration noted  Abdomen: Soft, gravid, appropriate for gestational age. Pain/Pressure: Absent     Pelvic:  Cervical exam deferred        Extremities: Normal range of motion.     Mental Status: Normal mood and affect. Normal behavior. Normal judgment and thought content.     Assessment   26 y.o. G2P1001 at [redacted]w[redacted]d, EDD 02/04/2018 by Ultrasound presenting for routine prenatal visit.  Plan   pregnancy 2 Problems (from 05/12/17 to present)    Problem Noted Resolved   Supervision of other normal pregnancy, antepartum 07/14/2017 by Malachy Mood, MD No   Overview Signed  07/14/2017  4:43 PM by Malachy Mood, MD    Clinic [facility] Prenatal Labs  Dating 6 week [place] Blood type: O/Positive/-- (10/16 0935)   Genetic Screen 1 Screen:    AFP:     CF negative G1 Antibody:Negative (10/16 0935)  Anatomic [place]  Rubella: <0.90 (10/16 0935) Non-Immune Varicella: Immune  GTT Third trimester:  RPR: Non Reactive (10/16 0935)   Rhogam N/A HBsAg: Negative (10/16 0935)   TDaP vaccine                       Flu Shot: HIV: Negative  Baby Food                                GBS:   Contraception  Pap: 02/26/2016 NIL:  CBB   Pelvis tested to 6lbs 7oz  CS/VBAC    Support Person            Hx of preeclampsia, prior pregnancy, currently pregnant 07/14/2017 by Malachy Mood, MD No   Overview Signed 07/14/2017  4:45 PM by Malachy Mood, MD    [ ]  Aspirin 81 mg daily after 12 weeks; discontinue after 36 weeks [ ]  baseline labs with CBC, CMP, urine protein/creatinine ratio (P/C ratio 63)   Baseline and surveillance labs (pulled in from [facility], refresh links as needed)  Lab Results  Component Value Date   PLT 254 06/16/2017   CREATININE 0.78 06/16/2017   AST 25 06/16/2017   ALT 28 06/16/2017         First trimester screen/NT today.    Preterm labor symptoms and general obstetric precautions including but not limited to vaginal bleeding,  contractions, leaking of fluid and fetal movement were reviewed in detail with the patient.  Please refer to After Visit Summary for other counseling recommendations.   Return in about 4 weeks (around 08/26/2017) for ROB.  Avel Sensor, CNM 07/29/2017  4:09 PM

## 2017-08-04 LAB — FIRST TRIMESTER SCREEN W/NT
CRL: 73.5 mm
DIA MoM: 1.35
DIA Value: 295.9 pg/mL
GEST AGE-COLLECT: 13.1 wk
Maternal Age At EDD: 27.8 yr
NUCHAL TRANSLUCENCY MOM: 0.66
NUMBER OF FETUSES: 1
Nuchal Translucency: 1.2 mm
PAPP-A MoM: 1.37
PAPP-A VALUE: 1679.9 ng/mL
Test Results:: NEGATIVE
WEIGHT: 152 [lb_av]
hCG MoM: 1.51
hCG Value: 119.8 IU/mL

## 2017-08-27 ENCOUNTER — Ambulatory Visit (INDEPENDENT_AMBULATORY_CARE_PROVIDER_SITE_OTHER): Payer: 59 | Admitting: Obstetrics and Gynecology

## 2017-08-27 VITALS — BP 108/56 | Wt 159.0 lb

## 2017-08-27 DIAGNOSIS — Z363 Encounter for antenatal screening for malformations: Secondary | ICD-10-CM

## 2017-08-27 DIAGNOSIS — O09299 Supervision of pregnancy with other poor reproductive or obstetric history, unspecified trimester: Secondary | ICD-10-CM

## 2017-08-27 DIAGNOSIS — Z3A17 17 weeks gestation of pregnancy: Secondary | ICD-10-CM

## 2017-08-27 DIAGNOSIS — Z3480 Encounter for supervision of other normal pregnancy, unspecified trimester: Secondary | ICD-10-CM

## 2017-08-27 NOTE — Progress Notes (Signed)
ROB

## 2017-08-28 NOTE — Progress Notes (Signed)
Doing well no concerns,  +FM, no LOF.  Anatomy scan next visit

## 2017-09-01 NOTE — L&D Delivery Note (Signed)
Delivery Note At 9:03 PM a viable female was delivered via Vaginal, Spontaneous (Presentation:LOA).  APGAR: 8, 9; weight  pending.   Placenta status: spontaneous, intact with accessory lobe, placental cyst on larger lobe.  Cord: 3VC without complications  Anesthesia:  Epidural Episiotomy: None Lacerations: None Suture Repair: N/A Est. Blood Loss (mL):  441mL  Mom to postpartum.  Baby to Couplet care / Skin to Skin.  Malachy Mood 01/28/2018, 9:39 PM

## 2017-09-18 ENCOUNTER — Encounter: Payer: Self-pay | Admitting: Maternal Newborn

## 2017-09-18 ENCOUNTER — Ambulatory Visit (INDEPENDENT_AMBULATORY_CARE_PROVIDER_SITE_OTHER): Payer: 59 | Admitting: Maternal Newborn

## 2017-09-18 ENCOUNTER — Ambulatory Visit (INDEPENDENT_AMBULATORY_CARE_PROVIDER_SITE_OTHER): Payer: 59

## 2017-09-18 VITALS — BP 100/70 | Wt 160.0 lb

## 2017-09-18 DIAGNOSIS — Z3A20 20 weeks gestation of pregnancy: Secondary | ICD-10-CM

## 2017-09-18 DIAGNOSIS — Z3480 Encounter for supervision of other normal pregnancy, unspecified trimester: Secondary | ICD-10-CM

## 2017-09-18 DIAGNOSIS — Z3689 Encounter for other specified antenatal screening: Secondary | ICD-10-CM

## 2017-09-18 DIAGNOSIS — Z363 Encounter for antenatal screening for malformations: Secondary | ICD-10-CM | POA: Diagnosis not present

## 2017-09-18 DIAGNOSIS — O4440 Low lying placenta NOS or without hemorrhage, unspecified trimester: Secondary | ICD-10-CM

## 2017-09-18 NOTE — Progress Notes (Signed)
Routine Prenatal Care Visit  Subjective  Candace Higgins is a 28 y.o. G2P1001 at [redacted]w[redacted]d being seen today for ongoing prenatal care.  She is currently monitored for the following issues for this low-risk pregnancy and has Supervision of other normal pregnancy, antepartum; Hx of preeclampsia, prior pregnancy, currently pregnant; and Low-lying placenta on their problem list.  ----------------------------------------------------------------------------------- Patient reports no complaints.   Contractions: Not present. Vag. Bleeding: None.  Movement: Present. Denies leaking of fluid.  ----------------------------------------------------------------------------------- The following portions of the patient's history were reviewed and updated as appropriate: allergies, current medications, past family history, past medical history, past social history, past surgical history and problem list. Problem list updated.   Objective  Blood pressure 100/70, weight 160 lb (72.6 kg). Pregravid weight Pregravid weight not on file Total Weight Gain Not found. Urinalysis: Urine Protein: Negative Urine Glucose: Negative  Fetal Status: Fetal Heart Rate (bpm): 144   Movement: Present     General:  Alert, oriented and cooperative. Patient is in no acute distress.  Skin: Skin is warm and dry. No rash noted.   Cardiovascular: Normal heart rate noted  Respiratory: Normal respiratory effort, no problems with respiration noted  Abdomen: Soft, gravid, appropriate for gestational age. Pain/Pressure: Absent     Pelvic:  Cervical exam deferred        Extremities: Normal range of motion.  Edema: None  Mental Status: Normal mood and affect. Normal behavior. Normal judgment and thought content.     Assessment   27 y.o. G2P1001 at [redacted]w[redacted]d, EDD 02/04/2018 by Ultrasound presenting for routine prenatal visit.  Plan   pregnancy 2 Problems (from 05/12/17 to present)    Problem Noted Resolved   Supervision of other normal  pregnancy, antepartum 07/14/2017 by Malachy Mood, MD No   Overview Signed 07/14/2017  4:43 PM by Malachy Mood, [place] Prenatal Labs  Dating 6 week [place] Blood type: O/Positive/-- (10/16 0935)   Genetic Screen 1 Screen:    AFP:     CF negative G1 Antibody:Negative (10/16 0935)  Anatomic [place]  Rubella: <0.90 (10/16 0935) Non-Immune Varicella: Immune  GTT Third trimester:  RPR: Non Reactive (10/16 0935)   Rhogam N/A HBsAg: Negative (10/16 0935)   TDaP vaccine                       Flu Shot: HIV: Negative  Baby Food                                GBS:   Contraception  Pap: 02/26/2016 NIL:  CBB   Pelvis tested to 6lbs 7oz  CS/VBAC    Support Person            Hx of preeclampsia, prior pregnancy, currently pregnant 07/14/2017 by Malachy Mood, MD No   Overview Signed 07/14/2017  4:45 PM by Malachy Mood, MD    [ ]  Aspirin 81 mg daily after 12 weeks; discontinue after 36 weeks [ ]  baseline labs with CBC, CMP, urine protein/creatinine ratio (P/C ratio 63)   Baseline and surveillance labs (pulled in from [facility], refresh links as needed)  Lab Results  Component Value Date   PLT 254 06/16/2017   CREATININE 0.78 06/16/2017   AST 25 06/16/2017   ALT 28 06/16/2017           Two placental lobes and low lying placenta today. Need follow-up anatomy for face  and heart, along with placentation. It's a girl. All discussed with patient.  Preterm labor symptoms and general obstetric precautions including but not limited to vaginal bleeding, contractions, leaking of fluid and fetal movement were reviewed in detail with the patient.  Return in about 4 weeks (around 10/16/2017) for ROB and f/u anatomy ultrasound.  Avel Sensor, CNM 09/18/2017  3:06 PM

## 2017-09-18 NOTE — Progress Notes (Signed)
No concerns.rj 

## 2017-09-23 ENCOUNTER — Encounter: Payer: 59 | Admitting: Maternal Newborn

## 2017-10-09 ENCOUNTER — Other Ambulatory Visit: Payer: Self-pay | Admitting: Maternal Newborn

## 2017-10-09 DIAGNOSIS — Z3689 Encounter for other specified antenatal screening: Secondary | ICD-10-CM

## 2017-10-11 ENCOUNTER — Encounter: Payer: Self-pay | Admitting: Obstetrics and Gynecology

## 2017-10-16 ENCOUNTER — Ambulatory Visit (INDEPENDENT_AMBULATORY_CARE_PROVIDER_SITE_OTHER): Payer: 59 | Admitting: Obstetrics & Gynecology

## 2017-10-16 ENCOUNTER — Ambulatory Visit (INDEPENDENT_AMBULATORY_CARE_PROVIDER_SITE_OTHER): Payer: 59

## 2017-10-16 VITALS — BP 100/60 | Wt 166.0 lb

## 2017-10-16 DIAGNOSIS — Z3689 Encounter for other specified antenatal screening: Secondary | ICD-10-CM

## 2017-10-16 DIAGNOSIS — Z3480 Encounter for supervision of other normal pregnancy, unspecified trimester: Secondary | ICD-10-CM

## 2017-10-16 DIAGNOSIS — Z3A24 24 weeks gestation of pregnancy: Secondary | ICD-10-CM

## 2017-10-16 DIAGNOSIS — O09299 Supervision of pregnancy with other poor reproductive or obstetric history, unspecified trimester: Secondary | ICD-10-CM

## 2017-10-16 NOTE — Progress Notes (Signed)
  HPI: OB, no complaints  Ultrasound demonstrates all views complete now These findings are normal OB Placenta also no longer low lying  PMHx: She  has a past medical history of Benign neoplasm of breast (2013). Also,  has a past surgical history that includes Wisdom tooth extraction (2009) and Breast biopsy (2013)., family history includes Hypertension in her father and paternal grandfather.,  reports that  has never smoked. she has never used smokeless tobacco. She reports that she does not drink alcohol or use drugs.  She currently has no medications in their medication list. Also, has No Known Allergies.  ROS neg  Objective: BP 100/60   Wt 166 lb (75.3 kg)   LMP  (LMP Unknown)   BMI 26.79 kg/m   Physical examination Constitutional NAD, Conversant  Skin No rashes, lesions or ulceration.   Extremities: Moves all appropriately.  Normal ROM for age. No lymphadenopathy.  Neuro: Grossly intact  Psych: Oriented to PPT.  Normal mood. Normal affect.       FHT 140s  US Ob Follow Up  Result Date: 10/16/2017 ULTRASOUND REPORT Location: [facility] Date of Service: 10/16/2017 Indications:F/U Anatomy Findings: Nelda [place]se[place] intrauterine pregnancy is visualized with FHR at 157 BPM. Fetal presentation is Breech. Placenta: Anterior/ Posterior c-shaped, grade 0. (no longer low lying; 4.94cm) AFI: subjectively normal. Anatomic survey is complete. Impression: 1. [redacted]w[redacted]d Viable Singleton Intrauterine pregnancy previously established criteria. 2. Normal Anatomy Scan is now complete. 3. C-shaped placenta is no longer low lying. Recommendations: 1.Clinical correlation with the patient's History and Physical Exam. Edwena Bunde, RDMS, RVT  Assessment:  [redacted] weeks gestation of pregnancy - Plan: 28 Week RH+Panel  Supervision of other normal pregnancy, antepartum  Hx of preeclampsia, prior pregnancy, currently pregnant  Glc nv PNV  Barnett Applebaum, MD, [place]  [facility] 10/16/2017  4:25 PM

## 2017-10-16 NOTE — Patient Instructions (Signed)

## 2017-11-13 ENCOUNTER — Ambulatory Visit (INDEPENDENT_AMBULATORY_CARE_PROVIDER_SITE_OTHER): Payer: 59 | Admitting: Obstetrics and Gynecology

## 2017-11-13 ENCOUNTER — Other Ambulatory Visit: Payer: 59

## 2017-11-13 VITALS — BP 112/80 | Wt 177.0 lb

## 2017-11-13 DIAGNOSIS — O09299 Supervision of pregnancy with other poor reproductive or obstetric history, unspecified trimester: Secondary | ICD-10-CM

## 2017-11-13 DIAGNOSIS — Z3A28 28 weeks gestation of pregnancy: Secondary | ICD-10-CM

## 2017-11-13 DIAGNOSIS — Z3480 Encounter for supervision of other normal pregnancy, unspecified trimester: Secondary | ICD-10-CM

## 2017-11-13 DIAGNOSIS — Z7689 Persons encountering health services in other specified circumstances: Secondary | ICD-10-CM | POA: Diagnosis not present

## 2017-11-13 DIAGNOSIS — Z3A24 24 weeks gestation of pregnancy: Secondary | ICD-10-CM

## 2017-11-13 NOTE — Progress Notes (Signed)
Routine Prenatal Care Visit  Subjective  Candace Higgins is a 27 y.o. G2P1001 at [redacted]w[redacted]d being seen today for ongoing prenatal care.  She is currently monitored for the following issues for this low-risk pregnancy and has Supervision of other normal pregnancy, antepartum and Hx of preeclampsia, prior pregnancy, currently pregnant on their problem list.  ----------------------------------------------------------------------------------- Patient reports no complaints.  She has noted some increased lumbago, no dysuria.  Also increase in physiologic discharge, white to slightly off white.  No odor.  No pruritus.  Contractions: Not present. Vag. Bleeding: None.  Movement: Present. Denies leaking of fluid.  ----------------------------------------------------------------------------------- The following portions of the patient's history were reviewed and updated as appropriate: allergies, current medications, past family history, past medical history, past social history, past surgical history and problem list. Problem list updated.   Objective  Blood pressure 112/80, weight 177 lb (80.3 kg). Pregravid weight Pregravid weight not on file Total Weight Gain Not found. Urinalysis: Urine Protein: Negative Urine Glucose: Negative  Fetal Status: Fetal Heart Rate (bpm): 150 Fundal Height: 28 cm Movement: Present     General:  Alert, oriented and cooperative. Patient is in no acute distress.  Skin: Skin is warm and dry. No rash noted.   Cardiovascular: Normal heart rate noted  Respiratory: Normal respiratory effort, no problems with respiration noted  Abdomen: Soft, gravid, appropriate for gestational age. Pain/Pressure: Absent     Pelvic:  Cervical exam deferred        Extremities: Normal range of motion.     ental Status: Normal mood and affect. Normal behavior. Normal judgment and thought content.     Assessment   27 y.o. G2P1001 at [redacted]w[redacted]d by  02/04/2018, by Ultrasound presenting for routine  prenatal visit  Plan   pregnancy 2 Problems (from 05/12/17 to present)    Problem Noted Resolved   Supervision of other normal pregnancy, antepartum 07/14/2017 by Malachy Mood, MD No   Overview Signed 07/14/2017  4:43 PM by Malachy Mood, MD    Clinic [facility] Prenatal Labs  Dating 6 week [place] Blood type: O/Positive/-- (10/16 0935)   Genetic Screen 1 Screen:    AFP:     CF negative G1 Antibody:Negative (10/16 0935)  Anatomic [place]  Rubella: <0.90 (10/16 0935) Non-Immune Varicella: Immune  GTT Third trimester:  RPR: Non Reactive (10/16 0935)   Rhogam N/A HBsAg: Negative (10/16 0935)   TDaP vaccine                       Flu Shot: HIV: Negative  Baby Food                                GBS:   Contraception  Pap: 02/26/2016 NIL:  CBB   Pelvis tested to 6lbs 7oz  CS/VBAC    Support Person            Hx of preeclampsia, prior pregnancy, currently pregnant 07/14/2017 by Malachy Mood, MD No   Overview Signed 07/14/2017  4:45 PM by Malachy Mood, MD    [ ]  Aspirin 81 mg daily after 12 weeks; discontinue after 36 weeks [ ]  baseline labs with CBC, CMP, urine protein/creatinine ratio (P/C ratio 63)   Baseline and surveillance labs (pulled in from [facility], refresh links as needed)  Lab Results  Component Value Date   PLT 254 06/16/2017   CREATININE 0.78 06/16/2017   AST 25 06/16/2017  ALT 28 06/16/2017            Gestational age appropriate obstetric precautions including but not limited to vaginal bleeding, contractions, leaking of fluid and fetal movement were reviewed in detail with the patient.    - 28 week labs today - TDAP next visit  Return in about 2 weeks (around 11/27/2017) for rob.  Malachy Mood, MD, [facility], [place] [facility] 11/13/2017, 10:46 AM

## 2017-11-13 NOTE — Progress Notes (Signed)
ROB Vaginal discharge Low back pain GTT

## 2017-11-14 LAB — 28 WEEK RH+PANEL
BASOS ABS: 0 10*3/uL (ref 0.0–0.2)
Basos: 0 %
EOS (ABSOLUTE): 0.2 10*3/uL (ref 0.0–0.4)
EOS: 1 %
Gestational Diabetes Screen: 119 mg/dL (ref 65–139)
HIV Screen 4th Generation wRfx: NONREACTIVE
Hematocrit: 34.3 % (ref 34.0–46.6)
Hemoglobin: 11.1 g/dL (ref 11.1–15.9)
IMMATURE GRANS (ABS): 0.1 10*3/uL (ref 0.0–0.1)
IMMATURE GRANULOCYTES: 1 %
LYMPHS: 13 %
Lymphocytes Absolute: 1.6 10*3/uL (ref 0.7–3.1)
MCH: 33 pg (ref 26.6–33.0)
MCHC: 32.4 g/dL (ref 31.5–35.7)
MCV: 102 fL — AB (ref 79–97)
MONOCYTES: 5 %
Monocytes Absolute: 0.6 10*3/uL (ref 0.1–0.9)
NEUTROS PCT: 80 %
Neutrophils Absolute: 10.2 10*3/uL — ABNORMAL HIGH (ref 1.4–7.0)
PLATELETS: 208 10*3/uL (ref 150–379)
RBC: 3.36 x10E6/uL — ABNORMAL LOW (ref 3.77–5.28)
RDW: 13.7 % (ref 12.3–15.4)
RPR: NONREACTIVE
WBC: 12.6 10*3/uL — AB (ref 3.4–10.8)

## 2017-11-27 ENCOUNTER — Ambulatory Visit (INDEPENDENT_AMBULATORY_CARE_PROVIDER_SITE_OTHER): Payer: 59 | Admitting: Obstetrics and Gynecology

## 2017-11-27 VITALS — BP 110/72 | Wt 181.0 lb

## 2017-11-27 DIAGNOSIS — Z3A30 30 weeks gestation of pregnancy: Secondary | ICD-10-CM | POA: Diagnosis not present

## 2017-11-27 DIAGNOSIS — Z3480 Encounter for supervision of other normal pregnancy, unspecified trimester: Secondary | ICD-10-CM

## 2017-11-27 DIAGNOSIS — Z23 Encounter for immunization: Secondary | ICD-10-CM | POA: Diagnosis not present

## 2017-11-27 NOTE — Progress Notes (Signed)
Routine Prenatal Care Visit  Subjective  Candace Higgins is a 27 y.o. G2P1001 at [redacted]w[redacted]d being seen today for ongoing prenatal care.  She is currently monitored for the following issues for this low-risk pregnancy and has Supervision of other normal pregnancy, antepartum and Hx of preeclampsia, prior pregnancy, currently pregnant on their problem list.  ----------------------------------------------------------------------------------- Patient reports no complaints.   Contractions: Not present. Vag. Bleeding: None.  Movement: Present. Denies leaking of fluid.  ----------------------------------------------------------------------------------- The following portions of the patient's history were reviewed and updated as appropriate: allergies, current medications, past family history, past medical history, past social history, past surgical history and problem list. Problem list updated.   Objective  Blood pressure 110/72, weight 181 lb (82.1 kg). Pregravid weight Pregravid weight not on file Total Weight Gain Not found. Urinalysis: Urine Protein: Negative Urine Glucose: Negative  Fetal Status: Fetal Heart Rate (bpm): 145 Fundal Height: 30 cm Movement: Present     General:  Alert, oriented and cooperative. Patient is in no acute distress.  Skin: Skin is warm and dry. No rash noted.   Cardiovascular: Normal heart rate noted  Respiratory: Normal respiratory effort, no problems with respiration noted  Abdomen: Soft, gravid, appropriate for gestational age. Pain/Pressure: Absent     Pelvic:  Cervical exam deferred        Extremities: Normal range of motion.     ental Status: Normal mood and affect. Normal behavior. Normal judgment and thought content.     Assessment   27 y.o. G2P1001 at [redacted]w[redacted]d by  02/04/2018, by Ultrasound presenting for routine prenatal visit  Plan   pregnancy 2 Problems (from 05/12/17 to present)    Problem Noted Resolved   Supervision of other normal pregnancy,  antepartum 07/14/2017 by Malachy Mood, MD No   Overview Addendum 11/27/2017  2:06 PM by Malachy Mood, [place] Prenatal Labs  Dating 6 week [place] Blood type: O/Positive/-- (10/16 0935)   Genetic Screen 1 Screen:    AFP:     CF negative G1 Antibody:Negative (10/16 0935)  Anatomic [place] Normal female (low lying placenta resolved on follow up imaging) Rubella: <0.90 (10/16 0935) Non-Immune Varicella: Immune  GTT 119 RPR: Non Reactive (10/16 0935)   Rhogam N/A HBsAg: Negative (10/16 0935)   TDaP vaccine 11/27/2017 Flu Shot:06/2017 HIV: Negative  Baby Food                                GBS:   Contraception  Pap: 02/26/2016 NIL:  CBB   Pelvis tested to 6lbs 7oz  CS/VBAC    Support Person            Hx of preeclampsia, prior pregnancy, currently pregnant 07/14/2017 by Malachy Mood, MD No   Overview Signed 07/14/2017  4:45 PM by Malachy Mood, MD    [ ]  Aspirin 81 mg daily after 12 weeks; discontinue after 36 weeks [ ]  baseline labs with CBC, CMP, urine protein/creatinine ratio (P/C ratio 63)   Baseline and surveillance labs (pulled in from [facility], refresh links as needed)  Lab Results  Component Value Date   PLT 254 06/16/2017   CREATININE 0.78 06/16/2017   AST 25 06/16/2017   ALT 28 06/16/2017            Gestational age appropriate obstetric precautions including but not limited to vaginal bleeding, contractions, leaking of fluid and fetal movement were reviewed in detail with the  patient.    No follow-ups on file.  Malachy Mood, MD, [facility], [facility] 11/27/2017, 2:17 PM

## 2017-11-27 NOTE — Progress Notes (Signed)
ROB Vaginal discharge/itching TDAP given

## 2017-12-11 ENCOUNTER — Ambulatory Visit (INDEPENDENT_AMBULATORY_CARE_PROVIDER_SITE_OTHER): Payer: 59 | Admitting: Obstetrics and Gynecology

## 2017-12-11 VITALS — BP 118/64 | Wt 184.0 lb

## 2017-12-11 DIAGNOSIS — Z3A32 32 weeks gestation of pregnancy: Secondary | ICD-10-CM

## 2017-12-11 DIAGNOSIS — Z3480 Encounter for supervision of other normal pregnancy, unspecified trimester: Secondary | ICD-10-CM

## 2017-12-11 DIAGNOSIS — O09299 Supervision of pregnancy with other poor reproductive or obstetric history, unspecified trimester: Secondary | ICD-10-CM

## 2017-12-11 NOTE — Progress Notes (Signed)
Routine Prenatal Care Visit  Subjective  Candace Higgins is a 27 y.o. G2P1001 at [redacted]w[redacted]d being seen today for ongoing prenatal care.  She is currently monitored for the following issues for this low-risk pregnancy and has Supervision of other normal pregnancy, antepartum and Hx of preeclampsia, prior pregnancy, currently pregnant on their problem list.  ----------------------------------------------------------------------------------- Patient reports no complaints.   Contractions: Not present. Vag. Bleeding: None.  Movement: Present. Denies leaking of fluid.  ----------------------------------------------------------------------------------- The following portions of the patient's history were reviewed and updated as appropriate: allergies, current medications, past family history, past medical history, past social history, past surgical history and problem list. Problem list updated.   Objective  Blood pressure 118/64, weight 184 lb (83.5 kg). Pregravid weight Pregravid weight not on file Total Weight Gain Not found. Urinalysis: Urine Protein: Negative Urine Glucose: Negative  Fetal Status: Fetal Heart Rate (bpm): 155 Fundal Height: 32 cm Movement: Present     General:  Alert, oriented and cooperative. Patient is in no acute distress.  Skin: Skin is warm and dry. No rash noted.   Cardiovascular: Normal heart rate noted  Respiratory: Normal respiratory effort, no problems with respiration noted  Abdomen: Soft, gravid, appropriate for gestational age. Pain/Pressure: Absent     Pelvic:  Cervical exam deferred        Extremities: Normal range of motion.     ental Status: Normal mood and affect. Normal behavior. Normal judgment and thought content.     Assessment   27 y.o. G2P1001 at [redacted]w[redacted]d by  02/04/2018, by Ultrasound presenting for routine prenatal visit  Plan   pregnancy 2 Problems (from 05/12/17 to present)    Problem Noted Resolved   Supervision of other normal pregnancy,  antepartum 07/14/2017 by Malachy Mood, MD No   Overview Addendum 11/27/2017  2:18 PM by Malachy Mood, [place] Prenatal Labs  Dating 6 week [place] Blood type: O/Positive/-- (10/16 0935)   Genetic Screen 1 Screen:    AFP:     CF negative G1 Antibody:Negative (10/16 0935)  Anatomic [place] Normal female (low lying placenta resolved on follow up imaging) Rubella: <0.90 (10/16 0935) Non-Immune Varicella: Immune  GTT 119 RPR: Non Reactive (10/16 0935)   Rhogam N/A HBsAg: Negative (10/16 0935)   TDaP vaccine 11/27/2017 Flu Shot:06/2017 HIV: Negative  Baby Food Breast                        GBS:   Contraception Vasectomy Pap: 02/26/2016 NIL:  CBB   Pelvis tested to 6lbs 7oz  CS/VBAC    Support Person            Hx of preeclampsia, prior pregnancy, currently pregnant 07/14/2017 by Malachy Mood, MD No   Overview Signed 07/14/2017  4:45 PM by Malachy Mood, MD    [ ]  Aspirin 81 mg daily after 12 weeks; discontinue after 36 weeks [ ]  baseline labs with CBC, CMP, urine protein/creatinine ratio (P/C ratio 63)   Baseline and surveillance labs (pulled in from [facility], refresh links as needed)  Lab Results  Component Value Date   PLT 254 06/16/2017   CREATININE 0.78 06/16/2017   AST 25 06/16/2017   ALT 28 06/16/2017            Gestational age appropriate obstetric precautions including but not limited to vaginal bleeding, contractions, leaking of fluid and fetal movement were reviewed in detail with the patient.    Return in about  2 weeks (around 12/25/2017) for ROB.  Malachy Mood, MD, [facility], [facility] 12/11/2017, 11:40 AM

## 2017-12-11 NOTE — Progress Notes (Signed)
ROB

## 2017-12-25 ENCOUNTER — Ambulatory Visit (INDEPENDENT_AMBULATORY_CARE_PROVIDER_SITE_OTHER): Payer: 59 | Admitting: Maternal Newborn

## 2017-12-25 ENCOUNTER — Encounter: Payer: Self-pay | Admitting: Maternal Newborn

## 2017-12-25 VITALS — BP 120/80 | Wt 194.0 lb

## 2017-12-25 DIAGNOSIS — Z3480 Encounter for supervision of other normal pregnancy, unspecified trimester: Secondary | ICD-10-CM

## 2017-12-25 DIAGNOSIS — Z3A34 34 weeks gestation of pregnancy: Secondary | ICD-10-CM

## 2017-12-25 NOTE — Progress Notes (Signed)
Routine Prenatal Care Visit  Subjective  Candace Higgins is a 27 y.o. G2P1001 at [redacted]w[redacted]d being seen today for ongoing prenatal care.  She is currently monitored for the following issues for this low-risk pregnancy and has Supervision of other normal pregnancy, antepartum and Hx of preeclampsia, prior pregnancy, currently pregnant on their problem list.  ----------------------------------------------------------------------------------- Patient reports swelling in her feet and ankles.  No headaches or visual changes. Contractions: Not present. Vag. Bleeding: None.  Movement: Present. Denies leaking of fluid.  ----------------------------------------------------------------------------------- The following portions of the patient's history were reviewed and updated as appropriate: allergies, current medications, past family history, past medical history, past social history, past surgical history and problem list. Problem list updated.   Objective  Blood pressure 120/80, weight 194 lb (88 kg). Pregravid weight Pregravid weight not on file Total Weight Gain Not found. Urinalysis: Urine Protein: Negative Urine Glucose: Negative  Fetal Status: Fetal Heart Rate (bpm): 158 Fundal Height: 35 cm Movement: Present     General:  Alert, oriented and cooperative. Patient is in no acute distress.  Skin: Skin is warm and dry. No rash noted.   Cardiovascular: Normal heart rate noted  Respiratory: Normal respiratory effort, no problems with respiration noted  Abdomen: Soft, gravid, appropriate for gestational age. Pain/Pressure: Present     Pelvic:  Cervical exam deferred        Extremities: Normal range of motion.  Edema: Trace  Mental Status: Normal mood and affect. Normal behavior. Normal judgment and thought content.     Assessment   27 y.o. G2P1001 at [redacted]w[redacted]d, EDD 02/04/2018 by Ultrasound presenting for routine prenatal visit.  Plan   pregnancy 2 Problems (from 05/12/17 to present)    Problem  Noted Resolved   Supervision of other normal pregnancy, antepartum 07/14/2017 by Malachy Mood, MD No   Overview Addendum 11/27/2017  2:18 PM by Malachy Mood, [place] Prenatal Labs  Dating 6 week [place] Blood type: O/Positive/-- (10/16 0935)   Genetic Screen 1 Screen:    AFP:     CF negative G1 Antibody:Negative (10/16 0935)  Anatomic [place] Normal female (low lying placenta resolved on follow up imaging) Rubella: <0.90 (10/16 0935) Non-Immune Varicella: Immune  GTT 119 RPR: Non Reactive (10/16 0935)   Rhogam N/A HBsAg: Negative (10/16 0935)   TDaP vaccine 11/27/2017 Flu Shot:06/2017 HIV: Negative  Baby Food Breast                        GBS:   Contraception Vasectomy Pap: 02/26/2016 NIL:  CBB   Pelvis tested to 6lbs 7oz  CS/VBAC    Support Person            Hx of preeclampsia, prior pregnancy, currently pregnant 07/14/2017 by Malachy Mood, MD No   Overview Signed 07/14/2017  4:45 PM by Malachy Mood, MD    [ ]  Aspirin 81 mg daily after 12 weeks; discontinue after 36 weeks [ ]  baseline labs with CBC, CMP, urine protein/creatinine ratio (P/C ratio 63)   Baseline and surveillance labs (pulled in from [facility], refresh links as needed)  Lab Results  Component Value Date   PLT 254 06/16/2017   CREATININE 0.78 06/16/2017   AST 25 06/16/2017   ALT 28 06/16/2017         Discussed measures to help with edema such as compression stockings, elevation, water immersion.   Preterm labor symptoms and general obstetric precautions were reviewed.  Return in about  2 weeks (around 01/08/2018) for ROB.  Avel Sensor, CNM 12/25/2017  3:42 PM

## 2017-12-30 ENCOUNTER — Ambulatory Visit (INDEPENDENT_AMBULATORY_CARE_PROVIDER_SITE_OTHER): Payer: 59 | Admitting: Advanced Practice Midwife

## 2017-12-30 ENCOUNTER — Encounter: Payer: Self-pay | Admitting: Advanced Practice Midwife

## 2017-12-30 VITALS — BP 122/80 | Wt 193.0 lb

## 2017-12-30 DIAGNOSIS — B373 Candidiasis of vulva and vagina: Secondary | ICD-10-CM | POA: Diagnosis not present

## 2017-12-30 DIAGNOSIS — O23593 Infection of other part of genital tract in pregnancy, third trimester: Secondary | ICD-10-CM | POA: Diagnosis not present

## 2017-12-30 DIAGNOSIS — N898 Other specified noninflammatory disorders of vagina: Secondary | ICD-10-CM

## 2017-12-30 DIAGNOSIS — Z3A34 34 weeks gestation of pregnancy: Secondary | ICD-10-CM | POA: Diagnosis not present

## 2017-12-30 DIAGNOSIS — B9689 Other specified bacterial agents as the cause of diseases classified elsewhere: Secondary | ICD-10-CM

## 2017-12-30 DIAGNOSIS — N760 Acute vaginitis: Secondary | ICD-10-CM

## 2017-12-30 DIAGNOSIS — N941 Unspecified dyspareunia: Secondary | ICD-10-CM

## 2017-12-30 DIAGNOSIS — B3731 Acute candidiasis of vulva and vagina: Secondary | ICD-10-CM

## 2017-12-30 MED ORDER — METRONIDAZOLE 500 MG PO TABS: 500.0000 mg | ORAL_TABLET | Freq: Two times a day (BID) | ORAL | 0 refills | Status: AC

## 2017-12-30 MED ORDER — TERCONAZOLE 0.4 % VA CREA: 1.0000 | TOPICAL_CREAM | Freq: Every day | VAGINAL | 0 refills | Status: DC

## 2017-12-30 NOTE — Progress Notes (Signed)
Routine Prenatal Care Visit  Subjective  Candace Higgins is a 27 y.o. G2P1001 at [redacted]w[redacted]d being seen today for ongoing prenatal care.  She is currently monitored for the following issues for this high-risk pregnancy and has Supervision of other normal pregnancy, antepartum and Hx of preeclampsia, prior pregnancy, currently pregnant on their problem list.  ----------------------------------------------------------------------------------- Patient reports vaginal itching, thick white discharge, pain with intercourse for the past few weeks. Headache and blurry vision for the past 2 days. She has not tried any medication for the pain.   Contractions: Not present. Vag. Bleeding: None.  Movement: Present. Denies leaking of fluid.  ----------------------------------------------------------------------------------- The following portions of the patient's history were reviewed and updated as appropriate: allergies, current medications, past family history, past medical history, past social history, past surgical history and problem list. Problem list updated.   Objective  Blood pressure 122/80, weight 193 lb (87.5 kg). Pregravid weight Pregravid weight not on file Total Weight Gain Not found. Urinalysis: Urine Protein: Negative Urine Glucose: Negative  Fetal Status: Fetal Heart Rate (bpm): 142 Fundal Height: 37 cm Movement: Present     General:  Alert, oriented and cooperative. Patient is in no acute distress.  Skin: Skin is warm and dry. No rash noted.   Cardiovascular: Normal heart rate noted  Respiratory: Normal respiratory effort, no problems with respiration noted  Abdomen: Soft, gravid, appropriate for gestational age. Pain/Pressure: Present     Pelvic:  Cervical exam deferred      Wet prep (patient collected specimen) Positive for clue cells, Positive for yeast, Negative for odor  Extremities: Normal range of motion.  Edema: Mild pitting, slight indentation  Mental Status: Normal mood and  affect. Normal behavior. Normal judgment and thought content.   Assessment   27 y.o. G2P1001 at [redacted]w[redacted]d by  02/04/2018, by Ultrasound presenting for routine prenatal visit  Plan   pregnancy 2 Problems (from 05/12/17 to present)    Problem Noted Resolved   Supervision of other normal pregnancy, antepartum 07/14/2017 by Malachy Mood, MD No   Overview Addendum 11/27/2017  2:18 PM by Malachy Mood, [place] Prenatal Labs  Dating 6 week [place] Blood type: O/Positive/-- (10/16 0935)   Genetic Screen 1 Screen:    AFP:     CF negative G1 Antibody:Negative (10/16 0935)  Anatomic [place] Normal female (low lying placenta resolved on follow up imaging) Rubella: <0.90 (10/16 0935) Non-Immune Varicella: Immune  GTT 119 RPR: Non Reactive (10/16 0935)   Rhogam N/A HBsAg: Negative (10/16 0935)   TDaP vaccine 11/27/2017 Flu Shot:06/2017 HIV: Negative  Baby Food Breast                        GBS:   Contraception Vasectomy Pap: 02/26/2016 NIL:  CBB   Pelvis tested to 6lbs 7oz  CS/VBAC    Support Person            Hx of preeclampsia, prior pregnancy, currently pregnant 07/14/2017 by Malachy Mood, MD No   Overview Signed 07/14/2017  4:45 PM by Malachy Mood, MD    [ ]  Aspirin 81 mg daily after 12 weeks; discontinue after 36 weeks [ ]  baseline labs with CBC, CMP, urine protein/creatinine ratio (P/C ratio 63)   Baseline and surveillance labs (pulled in from [facility], refresh links as needed)  Lab Results  Component Value Date   PLT 254 06/16/2017   CREATININE 0.78 06/16/2017   AST 25 06/16/2017   ALT 28 06/16/2017  Preterm labor symptoms and general obstetric precautions including but not limited to vaginal bleeding, contractions, leaking of fluid and fetal movement were reviewed in detail with the patient. Please refer to After Visit Summary for other counseling recommendations.  Metronidazole Rx for BV Terconazole Rx for yeast Tylenol as needed for headache pain.  Return for worsening symptoms, BP check as needed.  Return in about 2 weeks (around 01/13/2018) for rob.  Rod Can, CNM 12/30/2017 11:49 AM

## 2017-12-30 NOTE — Patient Instructions (Signed)

## 2018-01-08 ENCOUNTER — Ambulatory Visit (INDEPENDENT_AMBULATORY_CARE_PROVIDER_SITE_OTHER): Payer: 59 | Admitting: Obstetrics and Gynecology

## 2018-01-08 VITALS — BP 116/80 | Wt 193.0 lb

## 2018-01-08 DIAGNOSIS — Z3480 Encounter for supervision of other normal pregnancy, unspecified trimester: Secondary | ICD-10-CM

## 2018-01-08 DIAGNOSIS — Z3685 Encounter for antenatal screening for Streptococcus B: Secondary | ICD-10-CM

## 2018-01-08 DIAGNOSIS — O09299 Supervision of pregnancy with other poor reproductive or obstetric history, unspecified trimester: Secondary | ICD-10-CM

## 2018-01-08 DIAGNOSIS — Z3A36 36 weeks gestation of pregnancy: Secondary | ICD-10-CM

## 2018-01-08 NOTE — Progress Notes (Signed)
ROB GBS  Hemorrhoids HA/Dizziness

## 2018-01-10 ENCOUNTER — Encounter (INDEPENDENT_AMBULATORY_CARE_PROVIDER_SITE_OTHER): Payer: Self-pay

## 2018-01-10 LAB — STREP GP B NAA: Strep Gp B NAA: NEGATIVE

## 2018-01-15 ENCOUNTER — Other Ambulatory Visit: Payer: Self-pay

## 2018-01-15 ENCOUNTER — Observation Stay
Admission: EM | Admit: 2018-01-15 | Discharge: 2018-01-15 | Disposition: A | Discharge status: Home / Self Care | Payer: 59 | Attending: Obstetrics & Gynecology | Admitting: Obstetrics & Gynecology

## 2018-01-15 ENCOUNTER — Ambulatory Visit (INDEPENDENT_AMBULATORY_CARE_PROVIDER_SITE_OTHER): Payer: 59 | Admitting: Obstetrics and Gynecology

## 2018-01-15 ENCOUNTER — Encounter: Payer: Self-pay | Admitting: *Deleted

## 2018-01-15 VITALS — BP 156/80 | Wt 199.0 lb

## 2018-01-15 DIAGNOSIS — Z3A37 37 weeks gestation of pregnancy: Secondary | ICD-10-CM | POA: Diagnosis not present

## 2018-01-15 DIAGNOSIS — O163 Unspecified maternal hypertension, third trimester: Secondary | ICD-10-CM | POA: Diagnosis present

## 2018-01-15 DIAGNOSIS — O09293 Supervision of pregnancy with other poor reproductive or obstetric history, third trimester: Principal | ICD-10-CM | POA: Insufficient documentation

## 2018-01-15 DIAGNOSIS — Z3480 Encounter for supervision of other normal pregnancy, unspecified trimester: Secondary | ICD-10-CM

## 2018-01-15 DIAGNOSIS — O09299 Supervision of pregnancy with other poor reproductive or obstetric history, unspecified trimester: Secondary | ICD-10-CM

## 2018-01-15 HISTORY — DX: Anxiety disorder, unspecified: F41.9

## 2018-01-15 LAB — PROTEIN / CREATININE RATIO, URINE
Creatinine, Urine: 59 mg/dL
Protein Creatinine Ratio: 0.27 mg/mg{Cre} — ABNORMAL HIGH (ref 0.00–0.15)
Total Protein, Urine: 16 mg/dL

## 2018-01-15 LAB — COMPREHENSIVE METABOLIC PANEL
ALBUMIN: 2.6 g/dL — AB (ref 3.5–5.0)
ALK PHOS: 142 U/L — AB (ref 38–126)
ALT: 15 U/L (ref 14–54)
AST: 26 U/L (ref 15–41)
Anion gap: 8 (ref 5–15)
BILIRUBIN TOTAL: 0.7 mg/dL (ref 0.3–1.2)
BUN: 11 mg/dL (ref 6–20)
CALCIUM: 8.8 mg/dL — AB (ref 8.9–10.3)
CO2: 18 mmol/L — ABNORMAL LOW (ref 22–32)
Chloride: 110 mmol/L (ref 101–111)
Creatinine, Ser: 0.54 mg/dL (ref 0.44–1.00)
GFR calc Af Amer: >60 mL/min (ref >60)
GFR calc non Af Amer: >60 mL/min (ref >60)
GLUCOSE: 127 mg/dL — AB (ref 65–99)
Potassium: 3.4 mmol/L — ABNORMAL LOW (ref 3.5–5.1)
SODIUM: 136 mmol/L (ref 135–145)
TOTAL PROTEIN: 5.9 g/dL — AB (ref 6.5–8.1)

## 2018-01-15 LAB — TYPE AND SCREEN
ABO/RH(D): O POS
Antibody Screen: NEGATIVE

## 2018-01-15 LAB — CBC
HEMATOCRIT: 30.1 % — AB (ref 35.0–47.0)
HEMOGLOBIN: 10.4 g/dL — AB (ref 12.0–16.0)
MCH: 34.2 pg — ABNORMAL HIGH (ref 26.0–34.0)
MCHC: 34.5 g/dL (ref 32.0–36.0)
MCV: 99.2 fL (ref 80.0–100.0)
Platelets: 232 10*3/uL (ref 150–440)
RBC: 3.04 MIL/uL — AB (ref 3.80–5.20)
RDW: 13.8 % (ref 11.5–14.5)
WBC: 13.8 10*3/uL — AB (ref 3.6–11.0)

## 2018-01-15 MED ORDER — ONDANSETRON HCL 4 MG/2ML IJ SOLN: 4.0000 mg | Freq: Four times a day (QID) | INTRAMUSCULAR | Status: DC | PRN

## 2018-01-15 MED ORDER — ACETAMINOPHEN 325 MG PO TABS
650.0000 mg | ORAL_TABLET | ORAL | Status: DC | PRN
Start: 2018-01-15 — End: 2018-01-15

## 2018-01-15 NOTE — Final Progress Note (Signed)
Physician Final Progress Note  Patient ID: JOEE IOVINE MRN: [id number] DOB/AGE: [birth year] 28 y.o.  Admit date: 01/15/2018 Admitting provider: Gae Dry, MD Discharge date: 01/15/2018  Admission Diagnoses: Elevated blood pressure at [redacted] weeks EGA pregnancy  Discharge Diagnoses:  Active Problems:   Hx of preeclampsia, prior pregnancy, currently pregnant   Elevated blood pressure affecting pregnancy in third trimester, antepartum   Consults: None  Significant Findings/ Diagnostic Studies:  Obstetrics Admission History & Physical   Hypertension   HPI:  27 y.o. G2P1001 @ [redacted]w[redacted]d (02/04/2018, by Ultrasound). Admitted on 01/15/2018:   Patient Active Problem List   Diagnosis Date Noted  . Elevated blood pressure affecting pregnancy in third trimester, antepartum 01/15/2018  . Supervision of other normal pregnancy, antepartum 07/14/2017  . Hx of preeclampsia, prior pregnancy, currently pregnant 07/14/2017    Presents for elevated BP noted in office today.  Occas headahe, relief w tylenol or rest.  Edema w 6 lb weight gain this week.  Prior preclampsia w first pregnancy.  Works in [employer], incl today.   Prenatal care at: at [facility] Pregnancy complicated by none.  ROS: A review of systems was performed and negative, except as stated in the above HPI. PMHx:  Past Medical History:  Diagnosis Date  . Anxiety   . Benign neoplasm of breast 2013   PSHx:  Past Surgical History:  Procedure Laterality Date  . BREAST BIOPSY  2013   left  . WISDOM TOOTH EXTRACTION  2009   Medications:  Medications Prior to Admission  Medication Sig Dispense Refill Last Dose  . prenatal vitamin w/FE, FA (PRENATAL 1 + 1) 27-1 MG TABS tablet Take 1 tablet by mouth daily at 12 noon.   01/15/2018 at Unknown time   Allergies: has No Known Allergies. OBHx:  OB History  Gravida Para Term Preterm AB Living  2 1 1     1   SAB TAB Ectopic Multiple Live Births          1    # Outcome Date GA Lbr  Len/2nd Weight Sex Delivery Anes PTL Lv  2 Current           1 Term             Obstetric Comments  First menstrual period age 24   PPJ:KDTOIZTI/[id number] except as detailed in HPI.Marland Kitchen  No family history of birth defects. Soc Hx: Never smoker, Alcohol: none and Recreational drug use: none  Objective:   Vitals:   01/15/18 1736 01/15/18 1751  BP: 126/73 126/77  Pulse: 98 92  Resp:    Temp:     Constitutional: Well nourished, well developed female in no acute distress.  HEENT: normal Skin: Warm and dry.  Cardiovascular:Regular rate and rhythm.   Extremity: 1+ bilateral pedal edema Respiratory: Clear to auscultation bilateral. Normal respiratory effort Abdomen: gravid, NT, ND, FHT 140 Back: no CVAT Neuro: DTRs 2+, Cranial nerves grossly intact Psych: Alert and Oriented x3. No memory deficits. Normal mood and affect.  MS: normal gait, normal bilateral lower extremity ROM/strength/stability.    Procedures: A NST procedure was performed with FHR monitoring and a normal baseline established, appropriate time of 20-40 minutes of evaluation, and accels >2 seen w 15x15 characteristics.  Results show a REACTIVE NST.   Labs and BP and Monitoring reassuring without signs for preeclampsia Rest this weekend, no work BP check Monday am and determine work or continued rest then Return if sx's worsen IOL discussed for gest HTN beyond 37  weeks if persistant or worrisome    Pt agrees and prefers no IOL today  Discharge Condition: good  Disposition: Discharge disposition: 01-Home or Self Care      Results for orders placed or performed during the hospital encounter of 01/15/18  CBC  Result Value Ref Range   WBC 13.8 (H) 3.6 - 11.0 K/uL   RBC 3.04 (L) 3.80 - 5.20 MIL/uL   Hemoglobin 10.4 (L) 12.0 - 16.0 g/dL   HCT 30.1 (L) 35.0 - 47.0 %   MCV 99.2 80.0 - 100.0 fL   MCH 34.2 (H) 26.0 - 34.0 pg   MCHC 34.5 32.0 - 36.0 g/dL   RDW 13.8 11.5 - 14.5 %   Platelets 232 150 - 440 K/uL   Comprehensive metabolic panel  Result Value Ref Range   Sodium 136 135 - 145 mmol/L   Potassium 3.4 (L) 3.5 - 5.1 mmol/L   Chloride 110 101 - 111 mmol/L   CO2 18 (L) 22 - 32 mmol/L   Glucose, Bld 127 (H) 65 - 99 mg/dL   BUN 11 6 - 20 mg/dL   Creatinine, Ser 0.54 0.44 - 1.00 mg/dL   Calcium 8.8 (L) 8.9 - 10.3 mg/dL   Total Protein 5.9 (L) 6.5 - 8.1 g/dL   Albumin 2.6 (L) 3.5 - 5.0 g/dL   AST 26 15 - 41 U/L   ALT 15 14 - 54 U/L   Alkaline Phosphatase 142 (H) 38 - 126 U/L   Total Bilirubin 0.7 0.3 - 1.2 mg/dL   GFR calc non Af Amer >60 >60 mL/min   GFR calc Af Amer >60 >60 mL/min   Anion gap 8 5 - 15  Type and screen [facility]  Result Value Ref Range   ABO/RH(D) PENDING    Antibody Screen PENDING    Sample Expiration      01/18/2018 Performed at [facility], [address] [place], [place] [address]    Diet: Regular diet  Discharge Activity: Activity as tolerated  Discharge Instructions    Call MD for:   Complete by:  As directed    Worsening contractions or pain; leakage of fluid; bleeding.   Diet general   Complete by:  As directed    Increase activity slowly   Complete by:  As directed      Allergies as of 01/15/2018   No Known Allergies     Medication List    TAKE these medications   prenatal vitamin w/FE, FA 27-1 MG Tabs tablet Take 1 tablet by mouth daily at 12 noon.      Follow-up Information    Gae Dry, MD. Go in 3 day(s).   Specialty:  Obstetrics and Gynecology Why:  See Dr Kenton Kingfisher at 8:00 Monday Contact information: [address] [place] [address] [phone]          Total time spent taking care of this patient: 15 minutes  Signed: Hoyt Koch 01/15/2018, 6:19 PM

## 2018-01-15 NOTE — Progress Notes (Signed)
ROB Low back pain Vaginal discharge Fetal movement has decreased B/P recheck 156/88

## 2018-01-15 NOTE — OB Triage Note (Signed)
Discharge home. To follow-up with Dr. Kenton Kingfisher Monday morning @ 0800. Candace Higgins

## 2018-01-15 NOTE — Progress Notes (Signed)
Routine Prenatal Care Visit  Subjective  Candace Higgins is a 27 y.o. G2P1001 at [redacted]w[redacted]d being seen today for ongoing prenatal care.  She is currently monitored for the following issues for this low-risk pregnancy and has Supervision of other normal pregnancy, antepartum and Hx of preeclampsia, prior pregnancy, currently pregnant on their problem list.  ----------------------------------------------------------------------------------- Patient reports increased pressure and swelling, movement has slowed down.  No headaches or vision changes.   Contractions: Irregular. Vag. Bleeding: None.  Movement: Present. Denies leaking of fluid.  ----------------------------------------------------------------------------------- The following portions of the patient's history were reviewed and updated as appropriate: allergies, current medications, past family history, past medical history, past social history, past surgical history and problem list. Problem list updated.   Objective  Blood pressure 130/88, weight 199 lb (90.3 kg). Pregravid weight Pregravid weight not on file Total Weight Gain Not found. Urinalysis:      Fetal Status: Fetal Heart Rate (bpm): 145 Fundal Height: 38 cm Movement: Present     General:  Alert, oriented and cooperative. Patient is in no acute distress.  Skin: Skin is warm and dry. No rash noted.   Cardiovascular: Normal heart rate noted  Respiratory: Normal respiratory effort, no problems with respiration noted  Abdomen: Soft, gravid, appropriate for gestational age. Pain/Pressure: Present     Pelvic:  Cervical exam deferred        Extremities: Normal range of motion.     ental Status: Normal mood and affect. Normal behavior. Normal judgment and thought content.     Assessment   27 y.o. G2P1001 at [redacted]w[redacted]d by  02/04/2018, by Ultrasound presenting for routine prenatal visit  Plan   pregnancy 2 Problems (from 05/12/17 to present)    Problem Noted Resolved   Supervision  of other normal pregnancy, antepartum 07/14/2017 by Malachy Mood, MD No   Overview Addendum 01/10/2018  6:28 PM by Malachy Mood, [place] Prenatal Labs  Dating 6 week [place] Blood type: O/Positive/-- (10/16 0935)   Genetic Screen 1 Screen:    AFP:     CF negative G1 Antibody:Negative (10/16 0935)  Anatomic [place] Normal female (low lying placenta resolved on follow up imaging) Rubella: <0.90 (10/16 0935) Non-Immune Varicella: Immune  GTT 119 RPR: Non Reactive (10/16 0935)   Rhogam N/A HBsAg: Negative (10/16 0935)   TDaP vaccine 11/27/2017 Flu Shot:06/2017 HIV: Negative  Baby Food Breast                        WER:[id number]  Contraception Vasectomy Pap: 02/26/2016 NIL:  CBB   Pelvis tested to 6lbs 7oz  CS/VBAC    Support Person            Hx of preeclampsia, prior pregnancy, currently pregnant 07/14/2017 by Malachy Mood, MD No   Overview Signed 07/14/2017  4:45 PM by Malachy Mood, MD    [ ]  Aspirin 81 mg daily after 12 weeks; discontinue after 36 weeks [ ]  baseline labs with CBC, CMP, urine protein/creatinine ratio (P/C ratio 63)   Baseline and surveillance labs (pulled in from [facility], refresh links as needed)  Lab Results  Component Value Date   PLT 254 06/16/2017   CREATININE 0.78 06/16/2017   AST 25 06/16/2017   ALT 28 06/16/2017            Gestational age appropriate obstetric precautions including but not limited to vaginal bleeding, contractions, leaking of fluid and fetal movement were reviewed in detail with  the patient.   - repeat BP 156/80, 6lbs weight gain to L&D for serial BP and labs  Return in about 1 week (around 01/22/2018) for ROB.  Malachy Mood, MD, [facility], [facility] 01/15/2018, 3:55 PM

## 2018-01-15 NOTE — Discharge Instructions (Signed)
Hypertension During Pregnancy °Hypertension, commonly called high blood pressure, is when the force of blood pumping through your arteries is too strong. Arteries are blood vessels that carry blood from the heart throughout the body. Hypertension during pregnancy can cause problems for you and your baby. Your baby may be born early (prematurely) or may not weigh as much as he or she should at birth. Very bad cases of hypertension during pregnancy can be life-threatening. °Different types of hypertension can occur during pregnancy. These include: °· Chronic hypertension. This happens when: °? You have hypertension before pregnancy and it continues during pregnancy. °? You develop hypertension before you are [redacted] weeks pregnant, and it continues during pregnancy. °· Gestational hypertension. This is hypertension that develops after the 20th week of pregnancy. °· Preeclampsia, also called toxemia of pregnancy. This is a very serious type of hypertension that develops only during pregnancy. It affects the whole body, and it can be very dangerous for you and your baby. ° °Gestational hypertension and preeclampsia usually go away within 6 weeks after your baby is born. Women who have hypertension during pregnancy have a greater chance of developing hypertension later in life or during future pregnancies. °What are the causes? °The exact cause of hypertension is not known. °What increases the risk? °There are certain factors that make it more likely for you to develop hypertension during pregnancy. These include: °· Having hypertension during a previous pregnancy or prior to pregnancy. °· Being overweight. °· Being older than age 40. °· Being pregnant for the first time or being pregnant with more than one baby. °· Becoming pregnant using fertilization methods such as IVF (in vitro fertilization). °· Having diabetes, kidney problems, or systemic lupus erythematosus. °· Having a family history of hypertension. ° °What are the  signs or symptoms? °Chronic hypertension and gestational hypertension rarely cause symptoms. Preeclampsia causes symptoms, which may include: °· Increased protein in your urine. Your health care provider will check for this at every visit before you give birth (prenatal visit). °· Severe headaches. °· Sudden weight gain. °· Swelling of the hands, face, legs, and feet. °· Nausea and vomiting. °· Vision problems, such as blurred or double vision. °· Numbness in the face, arms, legs, and feet. °· Dizziness. °· Slurred speech. °· Sensitivity to bright lights. °· Abdominal pain. °· Convulsions. ° °How is this diagnosed? °You may be diagnosed with hypertension during a routine prenatal exam. At each prenatal visit, you may: °· Have a urine test to check for high amounts of protein in your urine. °· Have your blood pressure checked. A blood pressure reading is recorded as two numbers, such as "120 over 80" (or 120/80). The first ("top") number is called the systolic pressure. It is a measure of the pressure in your arteries when your heart beats. The second ("bottom") number is called the diastolic pressure. It is a measure of the pressure in your arteries as your heart relaxes between beats. Blood pressure is measured in a unit called mm Hg. A normal blood pressure reading is: °? Systolic: below 120. °? Diastolic: below 80. ° °The type of hypertension that you are diagnosed with depends on your test results and when your symptoms developed. °· Chronic hypertension is usually diagnosed before 20 weeks of pregnancy. °· Gestational hypertension is usually diagnosed after 20 weeks of pregnancy. °· Hypertension with high amounts of protein in the urine is diagnosed as preeclampsia. °· Blood pressure measurements that stay above 160 systolic, or above 110 diastolic, are   signs of severe preeclampsia. ° °How is this treated? °Treatment for hypertension during pregnancy varies depending on the type of hypertension you have and how  serious it is. °· If you take medicines called ACE inhibitors to treat chronic hypertension, you may need to switch medicines. ACE inhibitors should not be taken during pregnancy. °· If you have gestational hypertension, you may need to take blood pressure medicine. °· If you are at risk for preeclampsia, your health care provider may recommend that you take a low-dose aspirin every day to prevent high blood pressure during your pregnancy. °· If you have severe preeclampsia, you may need to be hospitalized so you and your baby can be monitored closely. You may also need to take medicine (magnesium sulfate) to prevent seizures and to lower blood pressure. This medicine may be given as an injection or through an IV tube. °· In some cases, if your condition gets worse, you may need to deliver your baby early. ° °Follow these instructions at home: °Eating and drinking °· Drink enough fluid to keep your urine clear or pale yellow. °· Eat a healthy diet that is low in salt (sodium). Do not add salt to your food. Check food labels to see how much sodium a food or beverage contains. °Lifestyle °· Do not use any products that contain nicotine or tobacco, such as cigarettes and e-cigarettes. If you need help quitting, ask your health care provider. °· Do not use alcohol. °· Avoid caffeine. °· Avoid stress as much as possible. Rest and get plenty of sleep. °General instructions °· Take over-the-counter and prescription medicines only as told by your health care provider. °· While lying down, lie on your left side. This keeps pressure off your baby. °· While sitting or lying down, raise (elevate) your feet. Try putting some pillows under your lower legs. °· Exercise regularly. Ask your health care provider what kinds of exercise are best for you. °· Keep all prenatal and follow-up visits as told by your health care provider. This is important. °Contact a health care provider if: °· You have symptoms that your health care  provider told you may require more treatment or monitoring, such as: °? Fever. °? Vomiting. °? Headache. °Get help right away if: °· You have severe abdominal pain or vomiting that does not get better with treatment. °· You suddenly develop swelling in your hands, ankles, or face. °· You gain 4 lbs (1.8 kg) or more in 1 week. °· You develop vaginal bleeding, or you have blood in your urine. °· You do not feel your baby moving as much as usual. °· You have blurred or double vision. °· You have muscle twitching or sudden tightening (spasms). °· You have shortness of breath. °· Your lips or fingernails turn blue. °This information is not intended to replace advice given to you by your health care provider. Make sure you discuss any questions you have with your health care provider. °Document Released: 05/06/2011 Document Revised: 03/07/2016 Document Reviewed: 02/01/2016 °Elsevier Interactive Patient Education © 2018 [employer] ° °

## 2018-01-15 NOTE — Discharge Summary (Signed)
  See FPN

## 2018-01-15 NOTE — OB Triage Note (Signed)
Sent from [facility] office for PIH eval. History of PIH with first pregnancy resulting in 37 week delivery. Dineen Kid

## 2018-01-16 LAB — RPR: RPR: NONREACTIVE

## 2018-01-18 ENCOUNTER — Ambulatory Visit (INDEPENDENT_AMBULATORY_CARE_PROVIDER_SITE_OTHER): Payer: 59 | Admitting: Obstetrics & Gynecology

## 2018-01-18 VITALS — BP 130/80 | Wt 198.0 lb

## 2018-01-18 DIAGNOSIS — O139 Gestational [pregnancy-induced] hypertension without significant proteinuria, unspecified trimester: Secondary | ICD-10-CM

## 2018-01-18 NOTE — Progress Notes (Signed)
Obstetrics & Gynecology Office Visit   Chief Complaint:  Chief Complaint  Patient presents with  . Blood Pressure Check    History of Present Illness: 27 y.o. G2P1001 being seen for follow up blood pressure check today.  The patient is currently pregnantThe established diagnosis for the patient is gestational hypertension.  She is currently on no antihypertensives.  She reports headaches and swelling.  Medication list reviewed no medications contraindicated for use in patient with current hypertension were noted.  Past Medical History:  Past Medical History:  Diagnosis Date  . Anxiety   . Benign neoplasm of breast 2013    Past Surgical History:  Past Surgical History:  Procedure Laterality Date  . BREAST BIOPSY  2013   left  . WISDOM TOOTH EXTRACTION  2009    Gynecologic History: No LMP recorded (lmp unknown). Patient is pregnant.  Obstetric History: G2P1001  Family History:  Family History  Problem Relation Age of Onset  . Hypertension Father   . Hypertension Paternal Grandfather     Social History:  Social History   Socioeconomic History  . Marital status: Single    Spouse name: Not on file  . Number of children: Not on file  . Years of education: Not on file  . Highest education level: Not on file  Occupational History  . Not on file  Social Needs  . Financial resource strain: Not on file  . Food insecurity:    Worry: Not on file    Inability: Not on file  . Transportation needs:    Medical: Not on file    Non-medical: Not on file  Tobacco Use  . Smoking status: Never Smoker  . Smokeless tobacco: Never Used  Substance and Sexual Activity  . Alcohol use: No  . Drug use: No  . Sexual activity: Yes  Lifestyle  . Physical activity:    Days per week: Not on file    Minutes per session: Not on file  . Stress: Not on file  Relationships  . Social connections:    Talks on phone: Not on file    Gets together: Not on file    Attends religious  service: Not on file    Active member of club or organization: Not on file    Attends meetings of clubs or organizations: Not on file    Relationship status: Not on file  . Intimate partner violence:    Fear of current or ex partner: Not on file    Emotionally abused: Not on file    Physically abused: Not on file    Forced sexual activity: Not on file  Other Topics Concern  . Not on file  Social History Narrative  . Not on file    Allergies:  No Known Allergies  Medications: Prior to Admission medications   Medication Sig Start Date End Date Taking? Authorizing Provider  prenatal vitamin w/FE, FA (PRENATAL 1 + 1) 27-1 MG TABS tablet Take 1 tablet by mouth daily at 12 noon.    [provider]    Review of Systems  Constitutional: Negative for chills, fever and malaise/fatigue.  HENT: Negative for congestion, sinus pain and sore throat.   Eyes: Negative for blurred vision and pain.  Respiratory: Negative for cough and wheezing.   Cardiovascular: Negative for chest pain and leg swelling.  Gastrointestinal: Negative for abdominal pain, constipation, diarrhea, heartburn, nausea and vomiting.  Genitourinary: Negative for dysuria, frequency, hematuria and urgency.  Musculoskeletal: Negative for  back pain, joint pain, myalgias and neck pain.  Skin: Negative for itching and rash.  Neurological: Positive for headaches. Negative for dizziness, tremors and weakness.  Endo/Heme/Allergies: Does not bruise/bleed easily.  Psychiatric/Behavioral: Negative for depression. The patient is not nervous/anxious and does not have insomnia.     Physical Exam Blood pressure 130/80, weight 198 lb (89.8 kg).  No LMP recorded (lmp unknown). Patient is pregnant.  General: NAD HEENT: normocephalic, anicteric Pulmonary: No increased work of breathing Cardiovascular: RRR, distal pulses 2+ Extremities: 1+edema, no erythema, no tenderness Neurologic: Grossly intact Psychiatric: mood  appropriate, affect full  Assessment: 27 y.o. G2P1001 presenting for blood pressure evaluation today  Plan: Visit Diagnoses    Gestational hypertension affecting second pregnancy    -  Primary    1) Blood pressure - blood pressure at today's visit is normotensive.  As a result continued bed rest and monitoring.  Out of work.  IOL based on worsening BPs discussed as she is > 37 weeks.. - additional blood work was not obtained.  Norma last week.  Barnett Applebaum, MD, [facility], [facility] 01/18/2018  8:13 AM

## 2018-01-22 ENCOUNTER — Encounter: Payer: 59 | Admitting: Obstetrics and Gynecology

## 2018-01-27 ENCOUNTER — Ambulatory Visit (INDEPENDENT_AMBULATORY_CARE_PROVIDER_SITE_OTHER): Payer: 59 | Admitting: Obstetrics and Gynecology

## 2018-01-27 VITALS — BP 132/80 | Wt 198.0 lb

## 2018-01-27 DIAGNOSIS — Z3A38 38 weeks gestation of pregnancy: Secondary | ICD-10-CM

## 2018-01-27 DIAGNOSIS — O09299 Supervision of pregnancy with other poor reproductive or obstetric history, unspecified trimester: Secondary | ICD-10-CM

## 2018-01-27 DIAGNOSIS — Z3480 Encounter for supervision of other normal pregnancy, unspecified trimester: Secondary | ICD-10-CM

## 2018-01-27 NOTE — Progress Notes (Signed)
ROB CTX/leaking fluid

## 2018-01-27 NOTE — Progress Notes (Signed)
Obstetric H&P   Chief Complaint: Schedule IOL  Prenatal Care Provider: [facility]  History of Present Illness: 27 y.o. G2P1001 [redacted]w[redacted]d by 02/04/2018, by Ultrasound presenting for routine prenatal visit today.  +FM, no VB, irregular contractions.  Did have some LOF, unclear if urine.  Was not a big gush.    Her pregnancy history is notable for a history of preeclampsia G1, has been normotensive other than one elevated BP in the third trimester with negative follow ups.   pregnancy 2 Problems (from 05/12/17 to present)    Problem Noted Resolved   Supervision of other normal pregnancy, antepartum 07/14/2017 by Malachy Mood, MD No   Overview Addendum 01/10/2018  6:28 PM by Malachy Mood, [place] Prenatal Labs  Dating 6 week [place] Blood type: O/Positive/-- (10/16 0935)   Genetic Screen 1 Screen:    AFP:     CF negative G1 Antibody:Negative (10/16 0935)  Anatomic [place] Normal female (low lying placenta resolved on follow up imaging) Rubella: <0.90 (10/16 0935) Non-Immune Varicella: Immune  GTT 119 RPR: Non Reactive (10/16 0935)   Rhogam N/A HBsAg: Negative (10/16 0935)   TDaP vaccine 11/27/2017 Flu Shot:06/2017 HIV: Negative  Baby Food Breast                        [id number]  Contraception Vasectomy Pap: 02/26/2016 NIL:  CBB   Pelvis tested to 6lbs 7oz  CS/VBAC    Support Person            Hx of preeclampsia, prior pregnancy, currently pregnant 07/14/2017 by Malachy Mood, MD No   Overview Signed 07/14/2017  4:45 PM by Malachy Mood, MD    [ ]  Aspirin 81 mg daily after 12 weeks; discontinue after 36 weeks [ ]  baseline labs with CBC, CMP, urine protein/creatinine ratio (P/C ratio 63)   Baseline and surveillance labs (pulled in from [facility], refresh links as needed)  Lab Results  Component Value Date   PLT 254 06/16/2017   CREATININE 0.78 06/16/2017   AST 25 06/16/2017   ALT 28 06/16/2017            Review of Systems: 10 point review of systems negative  unless otherwise noted in HPI  Past Medical History: Past Medical History:  Diagnosis Date  . Anxiety   . Benign neoplasm of breast 2013    Past Surgical History: Past Surgical History:  Procedure Laterality Date  . BREAST BIOPSY  2013   left  . [place] EXTRACTION  2009    Past Obstetric History: #: 1, Date: None, Sex: None, Weight: None, GA: None, Delivery: None, Apgar1: None, Apgar5: None, Living: None, Birth Comments: None  #: 2, Date: None, Sex: None, Weight: None, GA: None, Delivery: None, Apgar1: None, Apgar5: None, Living: None, Birth Comments: None   Past Gynecologic History:  Family History: Family History  Problem Relation Age of Onset  . Hypertension Father   . Hypertension Paternal Grandfather     Social History: Social History   Socioeconomic History  . Marital status: Single    Spouse name: Not on file  . Number of children: Not on file  . Years of education: Not on file  . Highest education level: Not on file  Occupational History  . Not on file  Social Needs  . Financial resource strain: Not on file  . Food insecurity:    Worry: Not on file    Inability: Not on file  .  Transportation needs:    Medical: Not on file    Non-medical: Not on file  Tobacco Use  . Smoking status: Never Smoker  . Smokeless tobacco: Never Used  Substance and Sexual Activity  . Alcohol use: No  . Drug use: No  . Sexual activity: Yes  Lifestyle  . Physical activity:    Days per week: Not on file    Minutes per session: Not on file  . Stress: Not on file  Relationships  . Social connections:    Talks on phone: Not on file    Gets together: Not on file    Attends religious service: Not on file    Active member of club or organization: Not on file    Attends meetings of clubs or organizations: Not on file    Relationship status: Not on file  . Intimate partner violence:    Fear of current or ex partner: Not on file    Emotionally abused: Not on file     Physically abused: Not on file    Forced sexual activity: Not on file  Other Topics Concern  . Not on file  Social History Narrative  . Not on file    Medications: Prior to Admission medications   Medication Sig Start Date End Date Taking? Authorizing Provider  prenatal vitamin w/FE, FA (PRENATAL 1 + 1) 27-1 MG TABS tablet Take 1 tablet by mouth daily at 12 noon.   Yes [provider]    Allergies: No Known Allergies  Physical Exam: Vitals: Blood pressure 132/80, weight 198 lb (89.8 kg).  General: NAD HEENT: normocephalic, anicteric Pulmonary: No increased work of breathing Cardiovascular: RRR, distal pulses 2+ Abdomen: Gravid, non-tender Le[place]: vtx 8lbs Genitourinary: 3/50/-2 Extremities: no edema, erythema, or tenderness Neurologic: Grossly intact Psychiatric: mood appropriate, affect full  Labs: No results found for this or any previous visit (from the past 24 hour(s)).  Assessment: 27 y.o. G2P1001 [redacted]w[redacted]d by 02/04/2018, routine OB and IOL scheduling  Plan: 1) IOL - proceed with IOL tomorrow [redacted]w[redacted]d, elective  2) Fetus - +FHT  3) PNL - Blood type --/--/O POS (05/17 1720) / Anti-bodyscreen NEG (05/17 1720) / Rubella <0.90 (10/16 0935) / Varicella Immune / RPR Non Reactive (05/17 1720) / HBsAg Negative (10/16 0935) / HIV Non Reactive (03/15 1026) / GBS Negative (05/10 1653)  4) Immunization History -  Immunization History  Administered Date(s) Administered  . Tdap 11/27/2017    5) Disposition - pending delivery  Malachy Mood, MD, [place] 01/28/2018, 7:57 AM

## 2018-01-28 ENCOUNTER — Inpatient Hospital Stay
Admission: EM | Admit: 2018-01-28 | Discharge: 2018-01-30 | DRG: 807 | Disposition: A | Discharge status: Home / Self Care | Payer: Commercial Managed Care - HMO | Attending: Obstetrics and Gynecology | Admitting: Obstetrics and Gynecology

## 2018-01-28 ENCOUNTER — Inpatient Hospital Stay: Payer: Commercial Managed Care - HMO | Admitting: Registered Nurse

## 2018-01-28 ENCOUNTER — Other Ambulatory Visit: Payer: Self-pay

## 2018-01-28 DIAGNOSIS — O09299 Supervision of pregnancy with other poor reproductive or obstetric history, unspecified trimester: Secondary | ICD-10-CM

## 2018-01-28 DIAGNOSIS — O43893 Other placental disorders, third trimester: Secondary | ICD-10-CM | POA: Diagnosis not present

## 2018-01-28 DIAGNOSIS — Z3480 Encounter for supervision of other normal pregnancy, unspecified trimester: Secondary | ICD-10-CM

## 2018-01-28 DIAGNOSIS — O26893 Other specified pregnancy related conditions, third trimester: Secondary | ICD-10-CM | POA: Diagnosis not present

## 2018-01-28 DIAGNOSIS — Z3A39 39 weeks gestation of pregnancy: Secondary | ICD-10-CM

## 2018-01-28 DIAGNOSIS — Z3490 Encounter for supervision of normal pregnancy, unspecified, unspecified trimester: Secondary | ICD-10-CM | POA: Diagnosis present

## 2018-01-28 LAB — CBC
HEMATOCRIT: 33.1 % — AB (ref 35.0–47.0)
HEMOGLOBIN: 11.2 g/dL — AB (ref 12.0–16.0)
MCH: 33.7 pg (ref 26.0–34.0)
MCHC: 33.9 g/dL (ref 32.0–36.0)
MCV: 99.4 fL (ref 80.0–100.0)
Platelets: 251 10*3/uL (ref 150–440)
RBC: 3.33 MIL/uL — ABNORMAL LOW (ref 3.80–5.20)
RDW: 14.6 % — ABNORMAL HIGH (ref 11.5–14.5)
WBC: 13.6 10*3/uL — ABNORMAL HIGH (ref 3.6–11.0)

## 2018-01-28 LAB — TYPE AND SCREEN
ABO/RH(D): O POS
Antibody Screen: NEGATIVE

## 2018-01-28 MED ORDER — TERBUTALINE SULFATE 1 MG/ML IJ SOLN: 0.2500 mg | Freq: Once | INTRAMUSCULAR | Status: DC | PRN

## 2018-01-28 MED ORDER — LACTATED RINGERS IV SOLN
500.0000 mL | INTRAVENOUS | Status: DC | PRN
  Administered 2018-01-28: 500 mL via INTRAVENOUS

## 2018-01-28 MED ORDER — FENTANYL 2.5 MCG/ML W/ROPIVACAINE 0.15% IN NS 100 ML EPIDURAL (ARMC)
EPIDURAL | Status: DC | PRN
  Administered 2018-01-28: 12 mL/h via EPIDURAL

## 2018-01-28 MED ORDER — ACETAMINOPHEN 325 MG PO TABS: 650.0000 mg | ORAL_TABLET | ORAL | Status: DC | PRN

## 2018-01-28 MED ORDER — OXYTOCIN 40 UNITS IN LACTATED RINGERS INFUSION - SIMPLE MED
1.0000 m[IU]/min | INTRAVENOUS | Status: DC
  Administered 2018-01-28: 2 m[IU]/min via INTRAVENOUS
  Filled 2018-01-28: qty 1000

## 2018-01-28 MED ORDER — LIDOCAINE HCL (PF) 1 % IJ SOLN
INTRAMUSCULAR | Status: DC | PRN
  Administered 2018-01-28: 3 mL via INTRADERMAL

## 2018-01-28 MED ORDER — FENTANYL 2.5 MCG/ML W/ROPIVACAINE 0.15% IN NS 100 ML EPIDURAL (ARMC)
EPIDURAL | Status: AC
  Filled 2018-01-28: qty 100

## 2018-01-28 MED ORDER — OXYTOCIN 40 UNITS IN LACTATED RINGERS INFUSION - SIMPLE MED
2.5000 [IU]/h | INTRAVENOUS | Status: DC
  Administered 2018-01-28: 2.5 [IU]/h via INTRAVENOUS
  Filled 2018-01-28: qty 1000

## 2018-01-28 MED ORDER — OXYTOCIN BOLUS FROM INFUSION
500.0000 mL | Freq: Once | INTRAVENOUS | Status: AC
  Administered 2018-01-28: 500 mL via INTRAVENOUS

## 2018-01-28 MED ORDER — LIDOCAINE HCL (PF) 1 % IJ SOLN: 30.0000 mL | INTRAMUSCULAR | Status: DC | PRN

## 2018-01-28 MED ORDER — BUPIVACAINE HCL (PF) 0.25 % IJ SOLN
INTRAMUSCULAR | Status: DC | PRN
  Administered 2018-01-28 (×2): 4 mL via EPIDURAL

## 2018-01-28 MED ORDER — ONDANSETRON HCL 4 MG/2ML IJ SOLN: 4.0000 mg | Freq: Four times a day (QID) | INTRAMUSCULAR | Status: DC | PRN

## 2018-01-28 MED ORDER — LACTATED RINGERS IV SOLN
INTRAVENOUS | Status: DC
  Administered 2018-01-28: 09:00:00 via INTRAVENOUS

## 2018-01-28 MED ORDER — SOD CITRATE-CITRIC ACID 500-334 MG/5ML PO SOLN: 30.0000 mL | ORAL | Status: DC | PRN

## 2018-01-28 MED ORDER — BUTORPHANOL TARTRATE 1 MG/ML IJ SOLN: 1.0000 mg | INTRAMUSCULAR | Status: DC | PRN

## 2018-01-28 MED ORDER — LIDOCAINE-EPINEPHRINE (PF) 1.5 %-1:200000 IJ SOLN
INTRAMUSCULAR | Status: DC | PRN
  Administered 2018-01-28: 3 mL via EPIDURAL

## 2018-01-28 NOTE — Discharge Summary (Signed)
Obstetric Discharge Summary Reason for Admission: induction of labor Prenatal Procedures: none Intrapartum Procedures: spontaneous vaginal delivery Postpartum Procedures: none, declined MMR Complications-Operative and Postpartum: none Hemoglobin  Date Value Ref Range Status  01/29/2018 10.3 (L) 12.0 - 16.0 g/dL Final  11/13/2017 11.1 11.1 - 15.9 g/dL Final   HCT  Date Value Ref Range Status  01/29/2018 30.3 (L) 35.0 - 47.0 % Final   Hematocrit  Date Value Ref Range Status  11/13/2017 34.3 34.0 - 46.6 % Final    Physical Exam:  General: alert, cooperative and no distress Lochia: appropriate Uterine Fundus: firm, U-1/ ML/NT Incision: NA DVT Evaluation: No evidence of DVT seen on physical exam.  Discharge Diagnoses: Term Pregnancy-delivered  Discharge Information: Date: 01/30/2018 Activity: pelvic rest Diet: routine Allergies as of 01/30/2018   No Known Allergies     Medication List    TAKE these medications   ibuprofen 600 MG tablet Commonly known as:  ADVIL,MOTRIN Take 1 tablet (600 mg total) by mouth every 6 (six) hours as needed for mild pain, moderate pain or cramping.   prenatal vitamin w/FE, FA 27-1 MG Tabs tablet Take 1 tablet by mouth daily at 12 noon.       Condition: stable Discharge to: home  Follow up in 4 weeks with Dr Georgianne Fick to schedule BTL  Newborn Data: Live born female /Maddie Birth Weight: 8#15oz  APGAR: 45, 9  Newborn Delivery   Birth date/time:  01/28/2018 21:03:00 Delivery type:  Vaginal, Spontaneous     Home with mother. Dalia Heading 01/30/2018, 8:44 AM

## 2018-01-28 NOTE — H&P (Addendum)
Candace Higgins is a G2P1001 at 31 weeks here for scheduled elective induction of labor. Her H&P remains the same as documented by Dr. Georgianne Fick on 01/27/2018. Her cervix is unchanged at 3/50/-2, and we will proceed with labor induction with Pitocin as ordered.   EFM: Baseline 130 bpm, accelerations present, decelerations absent. Toco: spontaenous uterine activity, irregular. Category I tracing.  Fetal well-being is reassuring. The patient understands the plan and has no questions at this time.  Avel Sensor, CNM 01/28/2018  9:16 AM

## 2018-01-28 NOTE — Plan of Care (Signed)
Pt came in for elective induction at [redacted]w[redacted]d. Pt educated on induction of labor and pitocin during labor. Pt tolerated contractions and labor well. Pt got epidural and SROM this afternoon. Pt states pain has been controlled well and she is now resting well. Plan of care has been communicated with pt and she has verbalized agreement and understanding.

## 2018-01-28 NOTE — Anesthesia Procedure Notes (Signed)
Epidural Patient location during procedure: OB Start time: 01/28/2018 2:20 PM End time: 01/28/2018 2:30 PM  Staffing Anesthesiologist: Gunnar Bulla, MD Resident/CRNA: Hedda Slade, CRNA Performed: resident/CRNA   Preanesthetic Checklist Completed: patient identified, site marked, surgical consent, pre-op evaluation, timeout performed, IV checked, risks and benefits discussed and monitors and equipment checked  Epidural Patient position: sitting Prep: ChloraPrep Patient monitoring: heart rate, continuous pulse ox and blood pressure Approach: midline Location: L3-L4 Injection technique: LOR saline  Needle:  Needle type: Tuohy  Needle gauge: 17 G Needle length: 9 cm and 9 Needle insertion depth: 6 cm Catheter type: closed end flexible Catheter size: 19 Gauge Catheter at skin depth: 12 cm Test dose: negative and 1.5% lidocaine with Epi 1:200 K  Assessment Events: blood not aspirated, injection not painful, no injection resistance, negative IV test and no paresthesia  Additional Notes 1 attempt Pt. Evaluated and documentation done after procedure finished. Patient identified. Risks/Benefits/Options discussed with patient including but not limited to bleeding, infection, nerve damage, paralysis, failed block, incomplete pain control, headache, blood pressure changes, nausea, vomiting, reactions to medication both or allergic, itching and postpartum back pain. Confirmed with bedside nurse the patient's most recent platelet count. Confirmed with patient that they are not currently taking any anticoagulation, have any bleeding history or any family history of bleeding disorders. Patient expressed understanding and wished to proceed. All questions were answered. Sterile technique was used throughout the entire procedure. Please see nursing notes for vital signs. Test dose was given through epidural catheter and negative prior to continuing to dose epidural or start infusion. Warning signs  of high block given to the patient including shortness of breath, tingling/numbness in hands, complete motor block, or any concerning symptoms with instructions to call for help. Patient was given instructions on fall risk and not to get out of bed. All questions and concerns addressed with instructions to call with any issues or inadequate analgesia.   Patient tolerated the insertion well without immediate complications.Reason for block:procedure for pain

## 2018-01-28 NOTE — Anesthesia Preprocedure Evaluation (Signed)
Anesthesia Evaluation  Patient identified by MRN, date of birth, ID band Patient awake    Reviewed: Allergy & Precautions, H&P , NPO status , Patient's Chart, lab work & pertinent test results  Airway Mallampati: III  TM Distance: >3 FB Neck ROM: full    Dental  (+) Teeth Intact   Pulmonary neg pulmonary ROS,           Cardiovascular Hypertension: preeclampsia with previous pregancy, one elevated BP this pregnancy       Neuro/Psych Anxiety negative neurological ROS     GI/Hepatic negative GI ROS, Neg liver ROS,   Endo/Other  negative endocrine ROS  Renal/GU negative Renal ROS  negative genitourinary   Musculoskeletal   Abdominal   Peds  Hematology negative hematology ROS (+)   Anesthesia Other Findings   Reproductive/Obstetrics (+) Pregnancy                             Anesthesia Physical Anesthesia Plan  ASA: II  Anesthesia Plan: Epidural   Post-op Pain Management:    Induction:   PONV Risk Score and Plan:   Airway Management Planned:   Additional Equipment:   Intra-op Plan:   Post-operative Plan:   Informed Consent: I have reviewed the patients History and Physical, chart, labs and discussed the procedure including the risks, benefits and alternatives for the proposed anesthesia with the patient or authorized representative who has indicated his/her understanding and acceptance.     Plan Discussed with: Anesthesiologist and CRNA  Anesthesia Plan Comments:         Anesthesia Quick Evaluation

## 2018-01-28 NOTE — Progress Notes (Signed)
   Subjective:  No concerns, comfortable with epidural in place  Objective:   Vitals: Blood pressure 121/77, pulse 84, temperature 97.9 F (36.6 C), temperature source Oral, resp. rate 16, height 5\' 6"  (1.676 m), weight 198 lb (89.8 kg), SpO2 99 %. General: NAD Abdomen: gravid, non-tender Cervical Exam:  Dilation: 3 Effacement (%): 70 Station: -2 Exam by:: Brahm Barbeau MD  FHT: 145, moderate, +accels, no decels Toco: q52min  Results for orders placed or performed during the hospital encounter of 01/28/18 (from the past 24 hour(s))  CBC     Status: Abnormal   Collection Time: 01/28/18  8:26 AM  Result Value Ref Range   WBC 13.6 (H) 3.6 - 11.0 K/uL   RBC 3.33 (L) 3.80 - 5.20 MIL/uL   Hemoglobin 11.2 (L) 12.0 - 16.0 g/dL   HCT 33.1 (L) 35.0 - 47.0 %   MCV 99.4 80.0 - 100.0 fL   MCH 33.7 26.0 - 34.0 pg   MCHC 33.9 32.0 - 36.0 g/dL   RDW 14.6 (H) 11.5 - 14.5 %   Platelets 251 150 - 440 K/uL  Type and screen     Status: None   Collection Time: 01/28/18  8:26 AM  Result Value Ref Range   ABO/RH(D) O POS    Antibody Screen NEG    Sample Expiration      01/31/2018 Performed at [facility], [address], [place], [place] [address]     Assessment:   27 y.o. G2P1001 [redacted]w[redacted]d elective IOL  Plan:   1) Labor - AROM clear, continue pitocin  2) Fetus - cat I tracing  Malachy Mood, MD, [place], [facility] 01/28/2018, 3:41 PM

## 2018-01-29 LAB — CBC
HCT: 30.3 % — ABNORMAL LOW (ref 35.0–47.0)
Hemoglobin: 10.3 g/dL — ABNORMAL LOW (ref 12.0–16.0)
MCH: 34.1 pg — ABNORMAL HIGH (ref 26.0–34.0)
MCHC: 34 g/dL (ref 32.0–36.0)
MCV: 100.3 fL — ABNORMAL HIGH (ref 80.0–100.0)
PLATELETS: 226 10*3/uL (ref 150–440)
RBC: 3.02 MIL/uL — ABNORMAL LOW (ref 3.80–5.20)
RDW: 14.9 % — AB (ref 11.5–14.5)
WBC: 14.4 10*3/uL — AB (ref 3.6–11.0)

## 2018-01-29 LAB — RPR: RPR Ser Ql: NONREACTIVE

## 2018-01-29 MED ORDER — OXYCODONE-ACETAMINOPHEN 5-325 MG PO TABS: 1.0000 | ORAL_TABLET | ORAL | Status: DC | PRN

## 2018-01-29 MED ORDER — ACETAMINOPHEN 325 MG PO TABS
650.0000 mg | ORAL_TABLET | ORAL | Status: DC | PRN
  Administered 2018-01-29: 650 mg via ORAL
  Filled 2018-01-29: qty 2

## 2018-01-29 MED ORDER — ONDANSETRON HCL 4 MG/2ML IJ SOLN: 4.0000 mg | INTRAMUSCULAR | Status: DC | PRN

## 2018-01-29 MED ORDER — SENNOSIDES-DOCUSATE SODIUM 8.6-50 MG PO TABS
2.0000 | ORAL_TABLET | ORAL | Status: DC
  Administered 2018-01-30: 2 via ORAL
  Filled 2018-01-29 (×2): qty 2

## 2018-01-29 MED ORDER — ONDANSETRON HCL 4 MG PO TABS: 4.0000 mg | ORAL_TABLET | ORAL | Status: DC | PRN

## 2018-01-29 MED ORDER — PRENATAL MULTIVITAMIN CH
1.0000 | ORAL_TABLET | Freq: Every day | ORAL | Status: DC
  Administered 2018-01-29: 1 via ORAL
  Filled 2018-01-29: qty 1

## 2018-01-29 MED ORDER — DIPHENHYDRAMINE HCL 25 MG PO CAPS: 25.0000 mg | ORAL_CAPSULE | Freq: Four times a day (QID) | ORAL | Status: DC | PRN

## 2018-01-29 MED ORDER — IBUPROFEN 600 MG PO TABS
600.0000 mg | ORAL_TABLET | Freq: Four times a day (QID) | ORAL | Status: DC
  Administered 2018-01-29 – 2018-01-30 (×5): 600 mg via ORAL
  Filled 2018-01-29 (×5): qty 1

## 2018-01-29 MED ORDER — SIMETHICONE 80 MG PO CHEW: 80.0000 mg | CHEWABLE_TABLET | ORAL | Status: DC | PRN

## 2018-01-29 MED ORDER — BENZOCAINE-MENTHOL 20-0.5 % EX AERO: 1.0000 "application " | INHALATION_SPRAY | CUTANEOUS | Status: DC | PRN

## 2018-01-29 MED ORDER — WITCH HAZEL-GLYCERIN EX PADS: 1.0000 "application " | MEDICATED_PAD | CUTANEOUS | Status: DC | PRN

## 2018-01-29 MED ORDER — COCONUT OIL OIL: 1.0000 "application " | TOPICAL_OIL | Status: DC | PRN

## 2018-01-29 MED ORDER — OXYCODONE-ACETAMINOPHEN 5-325 MG PO TABS: 2.0000 | ORAL_TABLET | ORAL | Status: DC | PRN

## 2018-01-29 MED ORDER — DIBUCAINE 1 % RE OINT: 1.0000 "application " | TOPICAL_OINTMENT | RECTAL | Status: DC | PRN

## 2018-01-29 NOTE — Lactation Note (Signed)
This note was copied from a baby's chart. Lactation Consultation Note  Patient Name: Candace Higgins SLPNP'Y Date: 01/29/2018 Reason for consult: Initial assessment;Mother's request Mom having difficulty latching baby to right breast, baby sleepy  Maternal Data Formula Feeding for Exclusion: No Does the patient have breastfeeding experience prior to this delivery?: Yes  Feeding Feeding Type: Breast Fed Length of feed: 30 min(15 min each breast) BAby has been gagging, spit small amts old, bloody mucous, pulls in lower jaw, tight lower jaw especially when latching to right breast, latches with little help on left, mom shown how to get deeper latch with gentle pressure on lower jaw and and assuring that lips are flanged out    LATCH Score Latch: Repeated attempts needed to sustain latch, nipple held in mouth throughout feeding, stimulation needed to elicit sucking reflex.(on right breast)  Audible Swallowing: A few with stimulation  Type of Nipple: Everted at rest and after stimulation  Comfort (Breast/Nipple): Soft / non-tender  Hold (Positioning): Assistance needed to correctly position infant at breast and maintain latch.  LATCH Score: 7  Interventions Interventions: Breast feeding basics reviewed;Assisted with latch;Hand express;Breast compression;Adjust position;Support pillows;Position options  Lactation Tools Discussed/Used WIC Program: No   Consult Status Consult Status: PRN    Ferol Luz 01/29/2018, 12:21 PM

## 2018-01-29 NOTE — Anesthesia Postprocedure Evaluation (Signed)
Anesthesia Post Note  Patient: Candace Higgins  Procedure(s) Performed: AN AD [place]  Patient location during evaluation: Mother Baby Anesthesia Type: Epidural Level of consciousness: awake, awake and alert and oriented Pain management: pain level controlled Vital Signs Assessment: post-procedure vital signs reviewed and stable Respiratory status: spontaneous breathing, nonlabored ventilation and respiratory function stable Cardiovascular status: blood pressure returned to baseline and stable Postop Assessment: no headache and no backache Anesthetic complications: no     Last Vitals:  Vitals:   01/29/18 0229 01/29/18 0452  BP: (!) 123/59 107/69  Pulse: (!) 102 99  Resp: 20 20  Temp: 36.7 C 36.8 C  SpO2: 98% 98%    Last Pain:  Vitals:   01/29/18 0630  TempSrc:   PainSc: 0-No pain                 Johnna Acosta

## 2018-01-29 NOTE — Progress Notes (Signed)
Post Partum Day 1 Subjective: Doing well, no complaints. She has not yet had a meal since delivery and is waiting for breakfast. Her pain is controlled well with PO medications. She is voiding and ambulating without difficulty. Breastfeeding is going well.  No CP SOB F/C N/V or leg pain No HA, change of vision, RUQ/epigastric pain  Objective: BP 119/82 (BP Location: Left Arm)   Pulse 85   Temp 97.6 F (36.4 C) (Oral)   Resp 18   Ht 5\' 6"  (1.676 m)   Wt 198 lb (89.8 kg)   LMP  (LMP Unknown)   SpO2 100%   Breastfeeding  BMI 31.96 kg/m    Physical Exam:  General: NAD CV: RRR Pulm: nl effort, CTABL Lochia: moderate Uterine Fundus: fundus firm and below umbilicus DVT Evaluation: no cords, ttp LEs   Recent Labs    01/28/18 0826 01/29/18 0652  HGB 11.2* 10.3*  HCT 33.1* 30.3*  WBC 13.6* 14.4*  PLT 251 226    Assessment/Plan: 27 y.o. G2P2002 postpartum day # 1  1. Continue routine postpartum care 2. O positive, Rubella Non-immune (vaccine declined), Varicella Immune 3. TDAP given antepartum 4. Breastfeeding/Contraception: plans interval tubal 5. Disposition: discharge to home tomorrow   Rod Can, CNM

## 2018-01-30 ENCOUNTER — Ambulatory Visit: Payer: Self-pay

## 2018-01-30 ENCOUNTER — Encounter: Payer: Self-pay | Admitting: Certified Nurse Midwife

## 2018-01-30 MED ORDER — IBUPROFEN 600 MG PO TABS: 600.0000 mg | ORAL_TABLET | Freq: Four times a day (QID) | ORAL | 1 refills | Status: DC | PRN

## 2018-01-30 NOTE — Lactation Note (Signed)
This note was copied from a baby's chart. Lactation Consultation Note  Patient Name: Candace Higgins RFFMB'W Date: 01/30/2018  Mom declines assistance with breast feeding stating she just tried and nipples getting sore so gave bottle.  Mom works for [employer] and said she would see [place] there if needed after D/C.  Community resources given and encouraged to call with any question, concerns or assistance.  Mom has hand pump that she is using in hospital and has [employer] at home.  Mom was unsuccessful with breast feeding first baby.  No trauma noted to nipples.  Demonstrated how to easily hand express and rub colostrum on nipples.  Mom has coconut oil.  Comfort gels given.   Maternal Data    Feeding    LATCH Score                   Interventions    Lactation Tools Discussed/Used     Consult Status      Jarold Motto 01/30/2018, 12:29 PM

## 2018-01-30 NOTE — Progress Notes (Signed)
Provided and reviewed discharge paperwork. Utilized teach back method, pt verbalized understanding. All questions answered, voiced no concerns. Pt to make follow up appointment in 4 weeks for BTL. To discharge with baby when husband/FOB returns. Will continue to monitor.

## 2018-02-01 LAB — SURGICAL PATHOLOGY

## 2018-02-09 ENCOUNTER — Telehealth: Payer: Self-pay

## 2018-02-09 NOTE — Telephone Encounter (Signed)
FMLA/DISABILITY  Forms (2) for [employer] and [employer] filled out, signature obtained, and given to [place] for processing.

## 2018-03-11 ENCOUNTER — Encounter: Payer: Self-pay | Admitting: Obstetrics and Gynecology

## 2018-03-11 ENCOUNTER — Ambulatory Visit (INDEPENDENT_AMBULATORY_CARE_PROVIDER_SITE_OTHER): Payer: 59 | Admitting: Obstetrics and Gynecology

## 2018-03-11 MED ORDER — NORETHIN-ETH ESTRAD-FE BIPHAS 1 MG-10 MCG / 10 MCG PO TABS: 1.0000 | ORAL_TABLET | Freq: Every day | ORAL | 3 refills | Status: DC

## 2018-03-11 NOTE — Progress Notes (Signed)
Postpartum Visit  Chief Complaint:  Chief Complaint  Patient presents with  . Postpartum Care    Vaginal delivery 5/30    History of Present Illness: Patient is a 27 y.o. [id number] presents for postpartum visit.  Date of delivery: 01/28/18 Type of delivery: Vaginal delivery - Vacuum or forceps assisted  no Episiotomy No.  Laceration: no  Pregnancy or labor problems:  no Any problems since the delivery:  no  Newborn Details:  SINGLETON :  1. BabyGender female. Birth weight: 8lbs 15oz Maternal Details:  Breast or formula feeding: plans to bottle feed Intercourse: No  Contraception after delivery: No  Any bowel or bladder issues: No  Post partum depression/anxiety noted:  no Edinburgh Post-Partum Depression Score:6 Date of last PAP: 02/26/2016 no abnormalities   Review of Systems: Review of Systems  Constitutional: Negative.   Gastrointestinal: Negative.     The following portions of the patient's history were reviewed and updated as appropriate: allergies, current medications, past family history, past medical history, past social history, past surgical history and problem list.  Past Medical History:  Past Medical History:  Diagnosis Date  . Anxiety   . Benign neoplasm of breast 2013    Past Surgical History:  Past Surgical History:  Procedure Laterality Date  . BREAST BIOPSY  2013   left  . WISDOM TOOTH EXTRACTION  2009    Family History:  Family History  Problem Relation Age of Onset  . Hypertension Father   . Hypertension Paternal Grandfather     Social History:  Social History   Socioeconomic History  . Marital status: Single    Spouse name: Not on file  . Number of children: Not on file  . Years of education: Not on file  . Highest education level: Not on file  Occupational History  . Not on file  Social Needs  . Financial resource strain: Not on file  . Food insecurity:    Worry: Not on file    Inability: Not on file  .  Transportation needs:    Medical: Not on file    Non-medical: Not on file  Tobacco Use  . Smoking status: Never Smoker  . Smokeless tobacco: Never Used  Substance and Sexual Activity  . Alcohol use: No  . Drug use: No  . Sexual activity: Yes  Lifestyle  . Physical activity:    Days per week: Not on file    Minutes per session: Not on file  . Stress: Not on file  Relationships  . Social connections:    Talks on phone: Not on file    Gets together: Not on file    Attends religious service: Not on file    Active member of club or organization: Not on file    Attends meetings of clubs or organizations: Not on file    Relationship status: Not on file  . Intimate partner violence:    Fear of current or ex partner: Not on file    Emotionally abused: Not on file    Physically abused: Not on file    Forced sexual activity: Not on file  Other Topics Concern  . Not on file  Social History Narrative  . Not on file    Allergies:  No Known Allergies  Medications: Prior to Admission medications   Medication Sig Start Date End Date Taking? Authorizing Provider  ibuprofen (ADVIL,MOTRIN) 600 MG tablet Take 1 tablet (600 mg total) by mouth every 6 (six) hours as  needed for mild pain, moderate pain or cramping. 01/30/18   Dalia Heading, CNM  prenatal vitamin w/FE, FA (PRENATAL 1 + 1) 27-1 MG TABS tablet Take 1 tablet by mouth daily at 12 noon.    [provider]    Physical Exam Blood pressure 126/84, pulse 79, height 5\' 6"  (1.676 m), weight 171 lb (77.6 kg), not currently breastfeeding.    General: NAD HEENT: normocephalic, anicteric Pulmonary: No increased work of breathing Abdomen: NABS, soft, non-tender, non-distended.  Umbilicus without lesions.  No hepatomegaly, splenomegaly or masses palpable. No evidence of hernia. Genitourinary:  External: Normal external female genitalia.  Normal urethral meatus, normal  Bartholin's and Skene's glands.    Vagina: Normal vaginal  mucosa, no evidence of prolapse.    Cervix: Grossly normal in appearance, no bleeding  Uterus: Non-enlarged, mobile, normal contour.  No CMT  Adnexa: ovaries non-enlarged, no adnexal masses  Rectal: deferred Extremities: no edema, erythema, or tenderness Neurologic: Grossly intact Psychiatric: mood appropriate, affect full    Assessment: 27 y.o. H2Z2248 presenting for 6 week postpartum visit  Plan: Problem List Items Addressed This Visit    None    Visit Diagnoses    Encounter for postpartum visit    -  Primary       1) Contraception - Education given regarding options for contraception, as well as compatibility with breast feeding if applicable.  Patient plans on vasectomy for contraception. - lo loestrin rx also provided  2)  Pap - ASCCP guidelines and rational discussed.  ASCCP guidelines and rational discussed.  Patient opts for every 3 years screening interval  3) Patient underwent screening for postpartum depression with no signs of depression  4) No follow-ups on file.   Malachy Mood, MD, [facility], [facility] 03/11/2018, 4:26 PM

## 2018-05-18 DIAGNOSIS — Z3483 Encounter for supervision of other normal pregnancy, third trimester: Secondary | ICD-10-CM | POA: Diagnosis not present

## 2018-05-18 DIAGNOSIS — Z3482 Encounter for supervision of other normal pregnancy, second trimester: Secondary | ICD-10-CM | POA: Diagnosis not present

## 2018-06-29 DIAGNOSIS — Z3482 Encounter for supervision of other normal pregnancy, second trimester: Secondary | ICD-10-CM | POA: Diagnosis not present

## 2018-06-29 DIAGNOSIS — Z3483 Encounter for supervision of other normal pregnancy, third trimester: Secondary | ICD-10-CM | POA: Diagnosis not present

## 2018-08-10 DIAGNOSIS — Z3483 Encounter for supervision of other normal pregnancy, third trimester: Secondary | ICD-10-CM | POA: Diagnosis not present

## 2018-08-10 DIAGNOSIS — Z3482 Encounter for supervision of other normal pregnancy, second trimester: Secondary | ICD-10-CM | POA: Diagnosis not present

## 2018-09-21 DIAGNOSIS — Z3482 Encounter for supervision of other normal pregnancy, second trimester: Secondary | ICD-10-CM | POA: Diagnosis not present

## 2018-09-21 DIAGNOSIS — Z3483 Encounter for supervision of other normal pregnancy, third trimester: Secondary | ICD-10-CM | POA: Diagnosis not present

## 2018-10-25 DIAGNOSIS — Z3482 Encounter for supervision of other normal pregnancy, second trimester: Secondary | ICD-10-CM | POA: Diagnosis not present

## 2018-10-25 DIAGNOSIS — Z3483 Encounter for supervision of other normal pregnancy, third trimester: Secondary | ICD-10-CM | POA: Diagnosis not present

## 2018-10-30 IMAGING — CR DG CHEST 2V
2 series · 2 of 2 positions shown · non-contrast
Comparison: None.

CLINICAL DATA: Persistent acid reflux symptoms since yesterday.
Dyspnea now.

EXAM:
CHEST  2 VIEW

[chest pa]
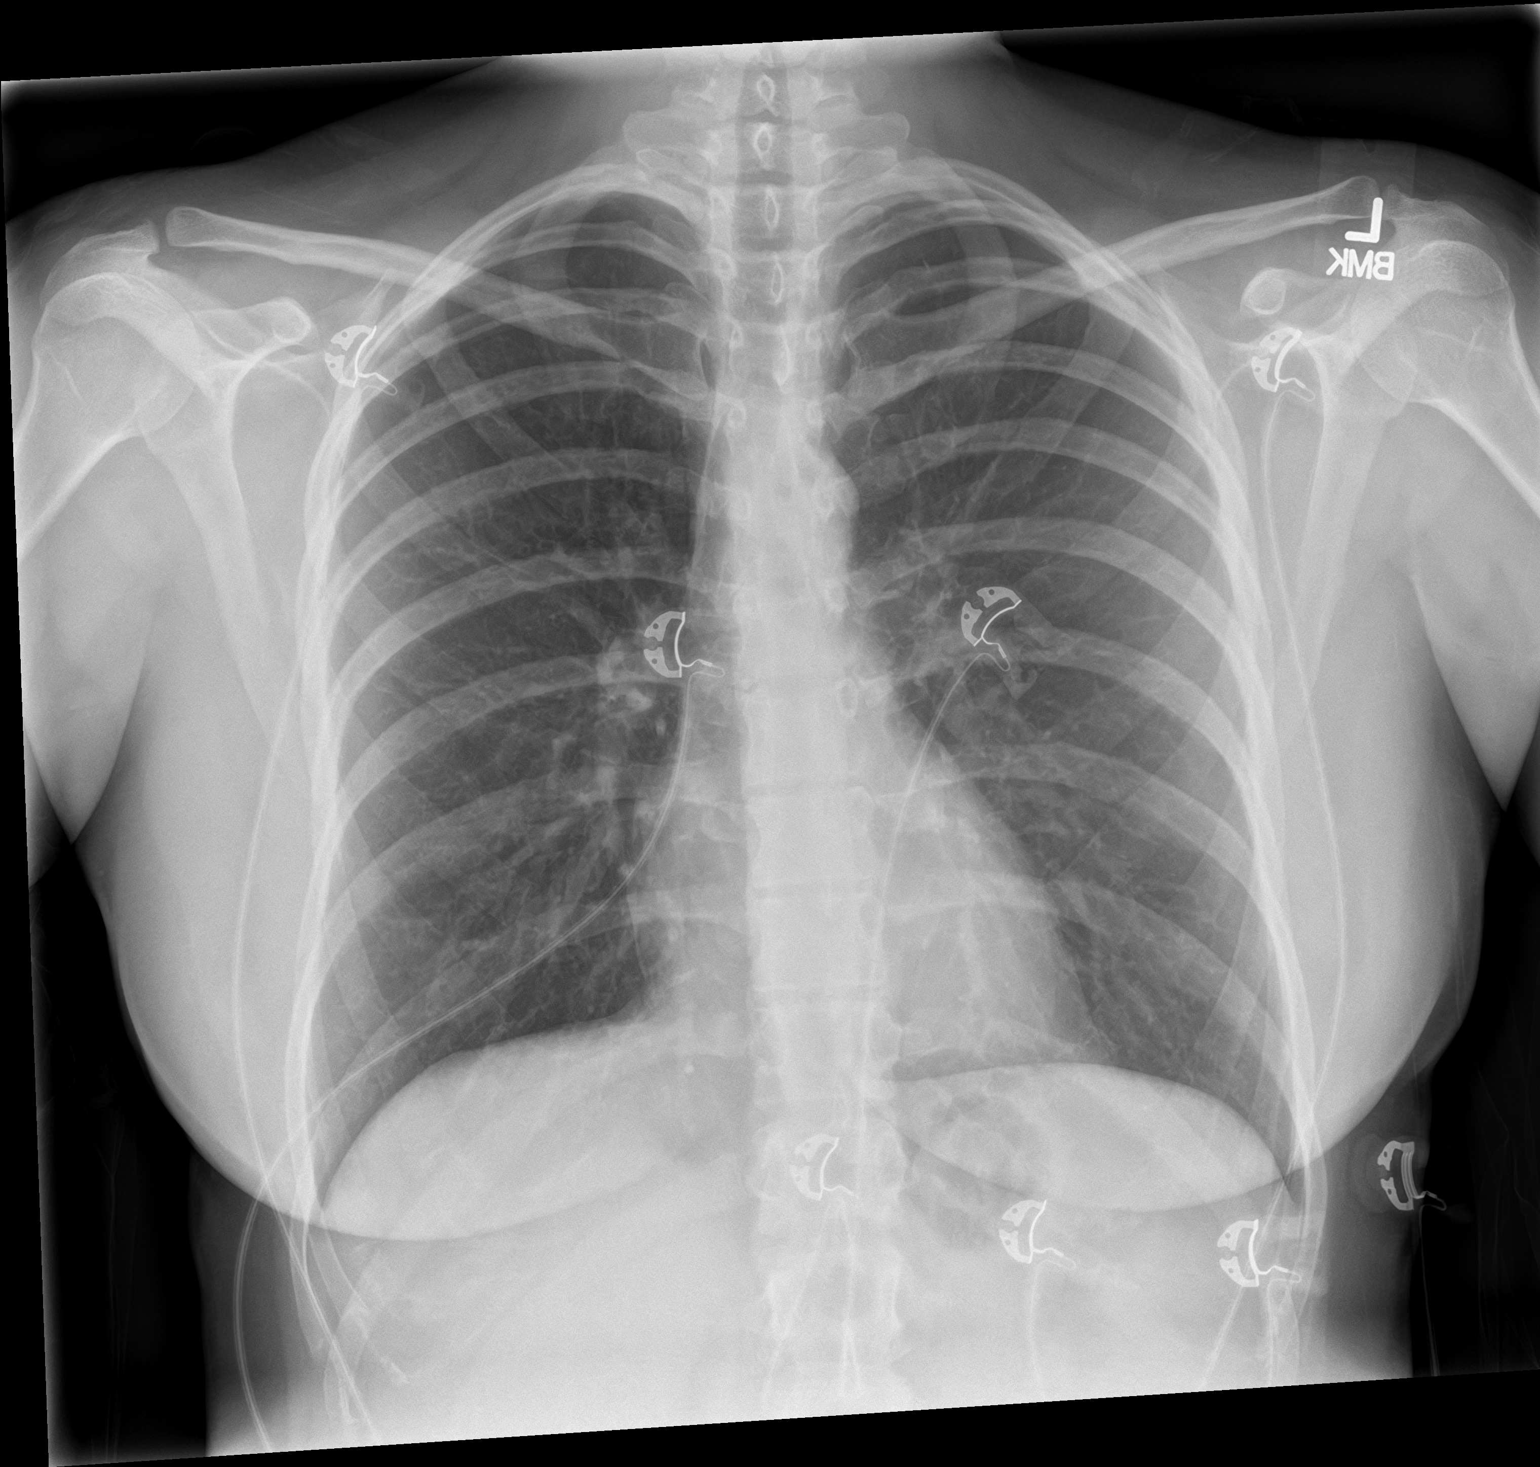

[chest lat]
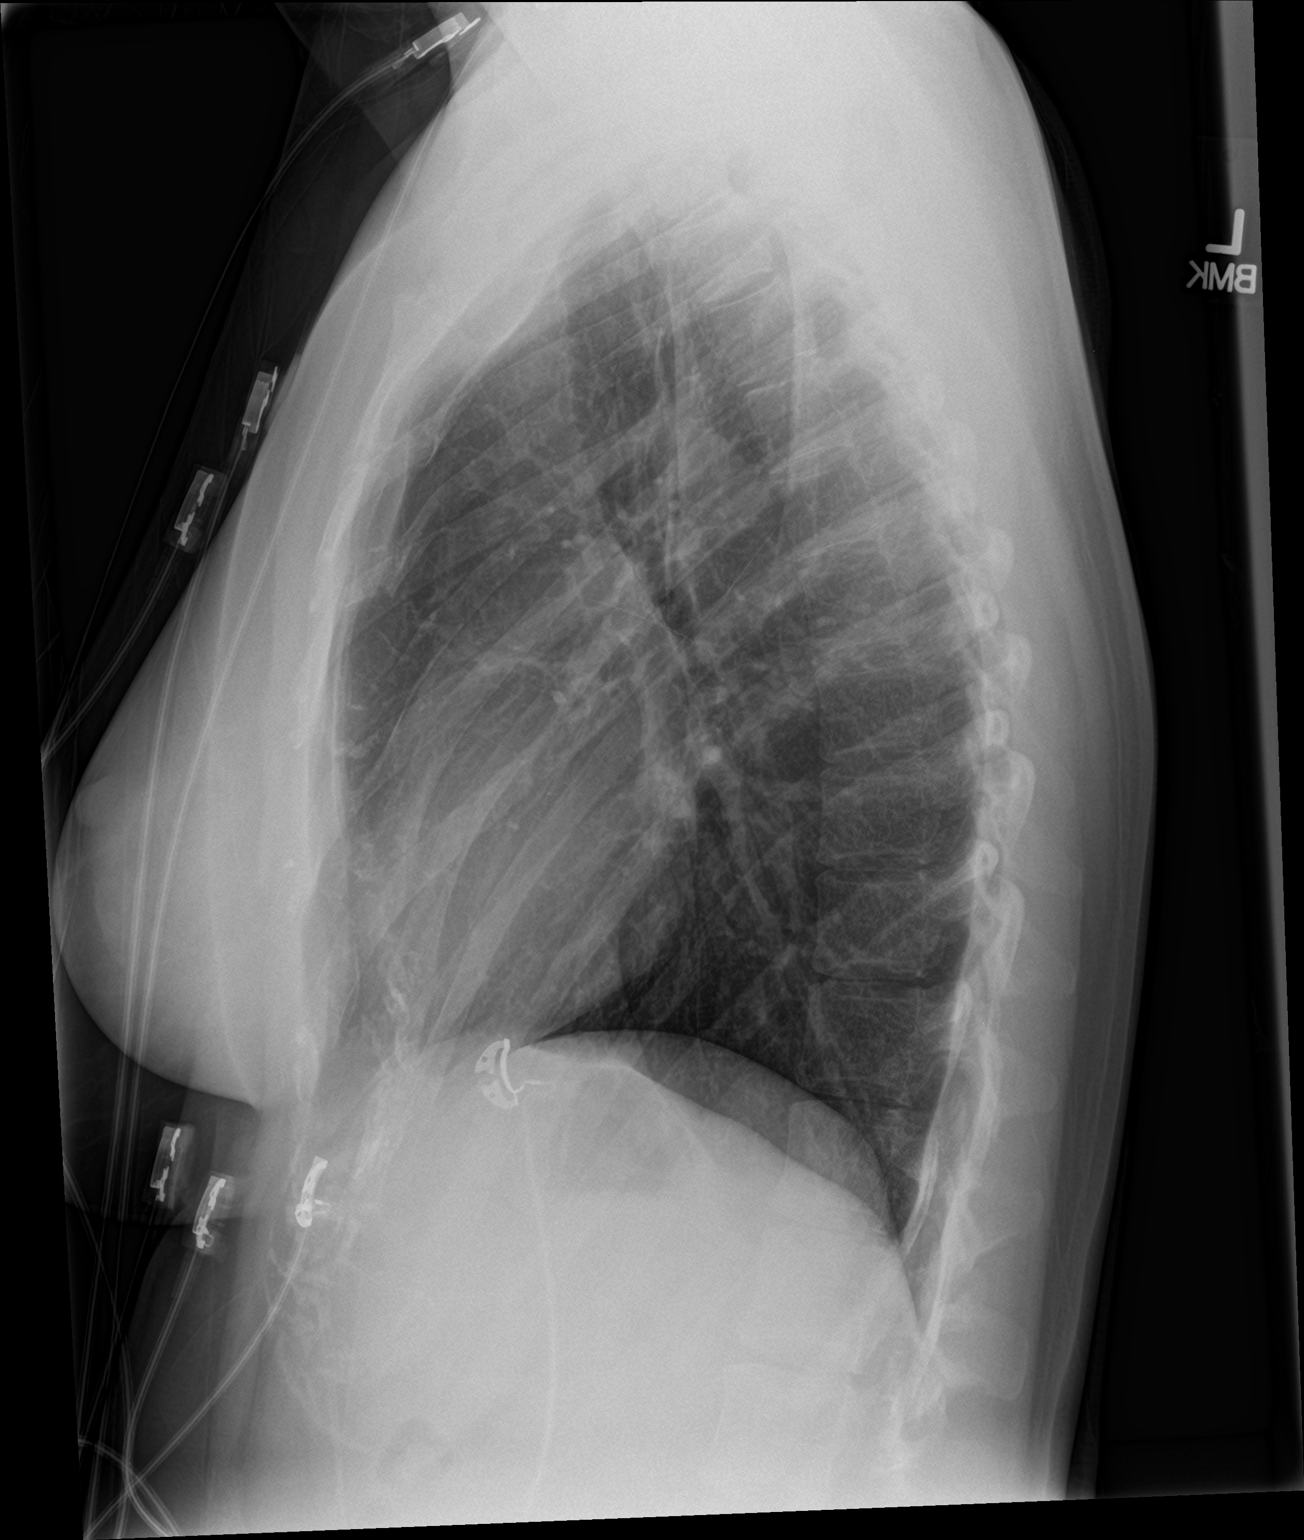

[2 of 2 positions shown; findings below may reference images not displayed]

FINDINGS: The lungs are clear. The pulmonary vasculature is normal. Heart size
is normal. Hilar and mediastinal contours are unremarkable. There is
no pleural effusion.
IMPRESSION: No active cardiopulmonary disease.

## 2018-11-24 DIAGNOSIS — Z3482 Encounter for supervision of other normal pregnancy, second trimester: Secondary | ICD-10-CM | POA: Diagnosis not present

## 2018-11-24 DIAGNOSIS — Z3483 Encounter for supervision of other normal pregnancy, third trimester: Secondary | ICD-10-CM | POA: Diagnosis not present

## 2018-12-06 ENCOUNTER — Other Ambulatory Visit: Payer: Self-pay

## 2018-12-06 MED ORDER — NORETHIN-ETH ESTRAD-FE BIPHAS 1 MG-10 MCG / 10 MCG PO TABS: 1.0000 | ORAL_TABLET | Freq: Every day | ORAL | 0 refills | Status: DC

## 2019-02-19 ENCOUNTER — Telehealth: Payer: Commercial Managed Care - HMO | Admitting: Nurse Practitioner

## 2019-02-19 DIAGNOSIS — J00 Acute nasopharyngitis [common cold]: Secondary | ICD-10-CM

## 2019-02-19 MED ORDER — FLUTICASONE PROPIONATE 50 MCG/ACT NA SUSP: 2.0000 | Freq: Every day | NASAL | 6 refills | Status: DC

## 2019-02-19 MED ORDER — BENZONATATE 100 MG PO CAPS: 100.0000 mg | ORAL_CAPSULE | Freq: Three times a day (TID) | ORAL | 0 refills | Status: DC | PRN

## 2019-02-19 NOTE — Progress Notes (Signed)
We are sorry you are not feeling well.  Here is how we plan to help!  Based on what you have shared with me, it looks like you may have a viral upper respiratory infection.  Upper respiratory infections are caused by a large number of viruses; however, rhinovirus is the most common cause.  I do not feel that this is the corona virus. if you develop a fever you may need to be tested  Symptoms vary from person to person, with common symptoms including sore throat, cough, and fatigue or lack of energy.  A low-grade fever of up to 100.4 may present, but is often uncommon.  Symptoms vary however, and are closely related to a person's age or underlying illnesses.  The most common symptoms associated with an upper respiratory infection are nasal discharge or congestion, cough, sneezing, headache and pressure in the ears and face.  These symptoms usually persist for about 3 to 10 days, but can last up to 2 weeks.  It is important to know that upper respiratory infections do not cause serious illness or complications in most cases.    Upper respiratory infections can be transmitted from person to person, with the most common method of transmission being a person's hands.  The virus is able to live on the skin and can infect other persons for up to 2 hours after direct contact.  Also, these can be transmitted when someone coughs or sneezes; thus, it is important to cover the mouth to reduce this risk.  To keep the spread of the illness at [place], good hand hygiene is very important.  This is an infection that is most likely caused by a virus. There are no specific treatments other than to help you with the symptoms until the infection runs its course.  We are sorry you are not feeling well.  Here is how we plan to help!   For nasal congestion, you may use an oral decongestants such as Mucinex D or if you have glaucoma or high blood pressure use plain Mucinex.  Saline nasal spray or nasal drops can help and can safely  be used as often as needed for congestion.  For your congestion, I have prescribed Fluticasone nasal spray one spray in each nostril twice a day  If you do not have a history of heart disease, hypertension, diabetes or thyroid disease, prostate/bladder issues or glaucoma, you may also use Sudafed to treat nasal congestion.  It is highly recommended that you consult with a pharmacist or your primary care physician to ensure this medication is safe for you to take.     If you have a cough, you may use cough suppressants such as Delsym and Robitussin.  If you have glaucoma or high blood pressure, you can also use Coricidin HBP.   For cough I have prescribed for you A prescription cough medication called Tessalon Perles 100 mg. You may take 1-2 capsules every 8 hours as needed for cough  If you have a sore or scratchy throat, use a saltwater gargle-  to  teaspoon of salt dissolved in a 4-ounce to 8-ounce glass of warm water.  Gargle the solution for approximately 15-30 seconds and then spit.  It is important not to swallow the solution.  You can also use throat lozenges/cough drops and Chloraseptic spray to help with throat pain or discomfort.  Warm or cold liquids can also be helpful in relieving throat pain.  For headache, pain or general discomfort, you can use  Ibuprofen or Tylenol as directed.   Some authorities believe that zinc sprays or the use of Echinacea may shorten the course of your symptoms.   HOME CARE . Only take medications as instructed by your medical team. . Be sure to drink plenty of fluids. Water is fine as well as fruit juices, sodas and electrolyte beverages. You may want to stay away from caffeine or alcohol. If you are nauseated, try taking small sips of liquids. How do you know if you are getting enough fluid? Your urine should be a pale yellow or almost colorless. . Get rest. . Taking a steamy shower or using a humidifier may help nasal congestion and ease sore throat pain.  You can place a towel over your head and breathe in the steam from hot water coming from a faucet. . Using a saline nasal spray works much the same way. . Cough drops, hard candies and sore throat lozenges may ease your cough. . Avoid close contacts especially the very young and the elderly . Cover your mouth if you cough or sneeze . Always remember to wash your hands.   GET HELP RIGHT AWAY IF: . You develop worsening fever. . If your symptoms do not improve within 10 days . You develop yellow or green discharge from your nose over 3 days. . You have coughing fits . You develop a severe head ache or visual changes. . You develop shortness of breath, difficulty breathing or start having chest pain . Your symptoms persist after you have completed your treatment plan  MAKE SURE YOU   Understand these instructions.  Will watch your condition.  Will get help right away if you are not doing well or get worse.  Your e-visit answers were reviewed by a board certified advanced clinical practitioner to complete your personal care plan. Depending upon the condition, your plan could have included both over the counter or prescription medications. Please review your pharmacy choice. If there is a problem, you may call our nursing hot line at and have the prescription routed to another pharmacy. Your safety is important to [place]. If you have drug allergies check your prescription carefully.   You can use MyChart to ask questions about today's visit, request a non-urgent call back, or ask for a work or school excuse for 24 hours related to this e-Visit. If it has been greater than 24 hours you will need to follow up with your provider, or enter a new e-Visit to address those concerns. You will get an e-mail in the next two days asking about your experience.  I hope that your e-visit has been valuable and will speed your recovery. Thank you for using e-visits.   5-10 minutes spent reviewing and  documenting in chart.

## 2019-05-16 ENCOUNTER — Other Ambulatory Visit: Payer: Self-pay | Admitting: Obstetrics and Gynecology

## 2019-05-16 NOTE — Telephone Encounter (Signed)
Pt overdue for annual exam

## 2019-08-04 ENCOUNTER — Telehealth: Payer: Self-pay | Admitting: Emergency Medicine

## 2019-08-04 DIAGNOSIS — Z20822 Contact with and (suspected) exposure to covid-19: Secondary | ICD-10-CM

## 2019-08-04 DIAGNOSIS — R6889 Other general symptoms and signs: Secondary | ICD-10-CM

## 2019-08-04 NOTE — Progress Notes (Signed)
E-Visit for Corona Virus Screening   Your current symptoms could be consistent with the coronavirus.  Many health care providers can now test patients at their office but not all are.  [facility] has multiple testing sites. For information on our COVID testing locations and hours go to [email or web address]  Please quarantine yourself while awaiting your test results.  We are enrolling you in our [place] for [place] . Daily you will receive a questionnaire within the [place] website. Our COVID 19 response team willl be monitoriing your responses daily. Please continue good preventive care measures, including:  frequent hand-washing, avoid touching your face, cover coughs/sneezes, stay out of crowds and keep a 6 foot distance from others.    COVID-19 is a respiratory illness with symptoms that are similar to the flu. Symptoms are typically mild to moderate, but there have been cases of severe illness and death due to the virus. The following symptoms may appear 2-14 days after exposure: . Fever . Cough . Shortness of breath or difficulty breathing . Chills . Repeated shaking with chills . Muscle pain . Headache . Sore throat . New loss of taste or smell . Fatigue . Congestion or runny nose . Nausea or vomiting . Diarrhea  If you develop fever/cough/breathlessness, please stay home for 10 days with improving symptoms and until you have had 24 hours of no fever (without taking a fever reducer).  Go to the nearest hospital ED for assessment if fever/cough/breathlessness are severe or illness seems like a threat to life.  It is vitally important that if you feel that you have an infection such as this virus or any other virus that you stay home and away from places where you may spread it to others.  You should avoid contact with people age 57 and older.   You should wear a mask or cloth face covering over your nose and mouth if you must be around other  people or animals, including pets (even at home). Try to stay at least 6 feet away from other people. This will protect the people around you.  You can use medication such as   ? [phone] mg acetaminophen (tylenol) every 6 hours, around the clock to help with associated fevers, sore throat, headaches, generalized body aches and malaise.  ? Oxymetazoline (afrin) intranasal spray once daily for no more than 3 days to help with congestion, after 3 days you can switch to another over-the-counter nasal steroid spray such as fluticasone (flonase) ? Allergy medication (loratadine, cetirizine, etc) and phenylephrine (sudafed) help with nasal congestion, runny nose and postnasal drip.   ? Dextromethorphan (Delsym) to suppress dry cough. Frequent coughing is likely causing your chest wall pain ? Guaifenesin (Mucinex) to help expectorate mucus and cough  Reduce your risk of any infection by using the same precautions used for avoiding the common cold or flu:  Marland Kitchen Wash your hands often with soap and warm water for at least 20 seconds.  If soap and water are not readily available, use an alcohol-based hand sanitizer with at least 60% alcohol.  . If coughing or sneezing, cover your mouth and nose by coughing or sneezing into the elbow areas of your shirt or coat, into a tissue or into your sleeve (not your hands). . Avoid shaking hands with others and consider head nods or verbal greetings only. . Avoid touching your eyes, nose, or mouth with unwashed hands.  . Avoid close contact with people who are sick. . Avoid places or  events with large numbers of people in one location, like concerts or sporting events. . Carefully consider travel plans you have or are making. . If you are planning any travel outside or inside the [place], visit the CDC's Travelers' Health webpage for the latest health notices. . If you have some symptoms but not all symptoms, continue to monitor at home and seek medical attention if your  symptoms worsen. . If you are having a medical emergency, call 911.  HOME CARE . Only take medications as instructed by your medical team. . Drink plenty of fluids and get plenty of rest. . A steam or ultrasonic humidifier can help if you have congestion.   GET HELP RIGHT AWAY IF YOU HAVE EMERGENCY WARNING SIGNS** FOR COVID-19. If you or someone is showing any of these signs seek emergency medical care immediately. Call 911 or proceed to your closest emergency facility if: . You develop worsening high fever. . Trouble breathing . Bluish lips or face . Persistent pain or pressure in the chest . New confusion . Inability to wake or stay awake . You cough up blood. . Your symptoms become more severe  **This list is not all possible symptoms. Contact your medical provider for any symptoms that are sever or concerning to you.   MAKE SURE YOU   Understand these instructions.  Will watch your condition.  Will get help right away if you are not doing well or get worse.  Your e-visit answers were reviewed by a board certified advanced clinical practitioner to complete your personal care plan.  Depending on the condition, your plan could have included both over the counter or prescription medications.  If there is a problem please reply once you have received a response from your provider.  Your safety is important to [place].  If you have drug allergies check your prescription carefully.    You can use MyChart to ask questions about today's visit, request a non-urgent call back, or ask for a work or school excuse for 24 hours related to this e-Visit. If it has been greater than 24 hours you will need to follow up with your provider, or enter a new e-Visit to address those concerns. You will get an e-mail in the next two days asking about your experience.  I hope that your e-visit has been valuable and will speed your recovery. Thank you for using e-visits.

## 2019-08-20 ENCOUNTER — Other Ambulatory Visit: Payer: Self-pay | Admitting: Obstetrics and Gynecology

## 2019-09-27 ENCOUNTER — Other Ambulatory Visit: Payer: Self-pay

## 2019-09-27 MED ORDER — LO LOESTRIN FE 1 MG-10 MCG / 10 MCG PO TABS: 1.0000 | ORAL_TABLET | Freq: Every day | ORAL | 0 refills | Status: DC

## 2019-12-09 ENCOUNTER — Other Ambulatory Visit: Payer: Self-pay

## 2019-12-09 ENCOUNTER — Ambulatory Visit: Payer: BC Managed Care – PPO | Admitting: Podiatry

## 2019-12-09 DIAGNOSIS — B351 Tinea unguium: Secondary | ICD-10-CM | POA: Diagnosis not present

## 2019-12-09 MED ORDER — TERBINAFINE HCL 250 MG PO TABS: 250.0000 mg | ORAL_TABLET | Freq: Every day | ORAL | 0 refills | Status: DC

## 2019-12-12 NOTE — Progress Notes (Signed)
   Subjective: 29 y.o. female presenting today as a new patient with a chief complaint of discoloration of the 2nd and 3rd nails of the left foot that began over a year ago. She states she dropped a couch on the foot and states the nail has been like this since. She states the 2nd nail looks like it is growing at an angle. She has not done anything for treatment and denies modifying factors. Patient is here for further evaluation and treatment.   Past Medical History:  Diagnosis Date  . Anxiety   . Benign neoplasm of breast 2013    Objective: Physical Exam General: The patient is alert and oriented x3 in no acute distress.  Dermatology: Hyperkeratotic, discolored, thickened, onychodystrophy noted to the left 2nd toenail. Skin is warm, dry and supple bilateral lower extremities. Negative for open lesions or macerations.  Vascular: Palpable pedal pulses bilaterally. No edema or erythema noted. Capillary refill within normal limits.  Neurological: Epicritic and protective threshold grossly intact bilaterally.   Musculoskeletal Exam: Range of motion within normal limits to all pedal and ankle joints bilateral. Muscle strength 5/5 in all groups bilateral.   Assessment: #1 Onychomycosis / dystrophic nail left 2nd toe    Plan of Care:  #1 Patient was evaluated. #2 Mechanical debridement of the left 2nd toenail performed using a nail nipper. Filed with dremel without incident.  #3 Prescription for Lamisil 250 mg #90 provided to patient.  #4 Inquired about liver function and patient declines any known history of issue or problems.  #5 Recommended OTC urea 40% topical gel.  #6 Return to clinic as needed.    Edrick Kins, DPM [facility]  Dr. Edrick Kins, [address]                                        [place], [place] [address]                Office [phone]  Fax [phone]

## 2019-12-15 ENCOUNTER — Other Ambulatory Visit: Payer: Self-pay | Admitting: Obstetrics and Gynecology

## 2020-01-03 ENCOUNTER — Ambulatory Visit: Payer: BC Managed Care – PPO | Admitting: Obstetrics and Gynecology

## 2020-01-12 ENCOUNTER — Other Ambulatory Visit: Payer: Self-pay

## 2020-01-12 ENCOUNTER — Other Ambulatory Visit (HOSPITAL_COMMUNITY)
Admission: RE | Admit: 2020-01-12 | Discharge: 2020-01-12 | Disposition: A | Discharge status: Home / Self Care | Payer: BC Managed Care – PPO | Source: Ambulatory Visit | Attending: Obstetrics and Gynecology | Admitting: Obstetrics and Gynecology

## 2020-01-12 ENCOUNTER — Encounter: Payer: Self-pay | Admitting: Obstetrics and Gynecology

## 2020-01-12 ENCOUNTER — Ambulatory Visit (INDEPENDENT_AMBULATORY_CARE_PROVIDER_SITE_OTHER): Payer: BC Managed Care – PPO | Admitting: Obstetrics and Gynecology

## 2020-01-12 VITALS — BP 110/70 | Ht 66.0 in | Wt 154.0 lb

## 2020-01-12 DIAGNOSIS — Z0000 Encounter for general adult medical examination without abnormal findings: Secondary | ICD-10-CM

## 2020-01-12 DIAGNOSIS — Z01419 Encounter for gynecological examination (general) (routine) without abnormal findings: Secondary | ICD-10-CM

## 2020-01-12 DIAGNOSIS — Z800 Family history of malignant neoplasm of digestive organs: Secondary | ICD-10-CM | POA: Diagnosis not present

## 2020-01-12 DIAGNOSIS — Z1239 Encounter for other screening for malignant neoplasm of breast: Secondary | ICD-10-CM

## 2020-01-12 DIAGNOSIS — Z124 Encounter for screening for malignant neoplasm of cervix: Secondary | ICD-10-CM | POA: Insufficient documentation

## 2020-01-12 MED ORDER — LO LOESTRIN FE 1 MG-10 MCG / 10 MCG PO TABS: 1.0000 | ORAL_TABLET | Freq: Every day | ORAL | 3 refills | Status: DC

## 2020-01-12 MED ORDER — PRENATAL PLUS 27-1 MG PO TABS: 1.0000 | ORAL_TABLET | Freq: Every day | ORAL | 3 refills | Status: DC

## 2020-01-12 NOTE — Progress Notes (Signed)
Gynecology Annual Exam   PCP: Patient, No Pcp Per  Chief Complaint:  Chief Complaint  Patient presents with  . Gynecologic Exam    History of Present Illness: Patient is a 29 y.o. G2P2002 presents for annual exam. The patient has no complaints today.   LMP: No LMP recorded. Amenorrhea on Lo Loestrin Fe  The patient is sexually active. She currently uses OCP (estrogen/progesterone) for contraception. She denies dyspareunia.  The patient does perform self breast exams.  There is no notable family history of breast or ovarian cancer in her family. Father colon cancer at age 82  The patient wears seatbelts: yes.   The patient has regular exercise: not asked.    The patient denies current symptoms of depression.    Review of Systems: Review of Systems  Constitutional: Negative.  Negative for chills and fever.  HENT: Negative for congestion.   Respiratory: Negative for cough and shortness of breath.   Cardiovascular: Negative for chest pain and palpitations.  Gastrointestinal: Negative.  Negative for abdominal pain, constipation, diarrhea, heartburn, nausea and vomiting.  Genitourinary: Negative.  Negative for dysuria, frequency and urgency.  Skin: Negative for itching and rash.  Neurological: Negative for dizziness and headaches.  Endo/Heme/Allergies: Negative for polydipsia.  Psychiatric/Behavioral: Negative for depression.    Past Medical History:  Patient Active Problem List   Diagnosis Date Noted  . Healthy adult on routine physical examination 01/12/2020  . Family history of colon cancer in father 01/12/2020    Diagnosed at age 4     Past Surgical History:  Past Surgical History:  Procedure Laterality Date  . BREAST BIOPSY  2013   left  . WISDOM TOOTH EXTRACTION  2009    Gynecologic History:  No LMP recorded. Contraception: OCP (estrogen/progesterone) Last Pap: Results were:06/16/2017 no abnormalities   Obstetric History: [id number]  Family History:   Family History  Problem Relation Age of Onset  . Hypertension Father   . Colon cancer Father 12  . Hypertension Paternal Grandfather     Social History:  Social History   Socioeconomic History  . Marital status: Single    Spouse name: Not on file  . Number of children: Not on file  . Years of education: Not on file  . Highest education level: Not on file  Occupational History  . Not on file  Tobacco Use  . Smoking status: Never Smoker  . Smokeless tobacco: Never Used  Substance and Sexual Activity  . Alcohol use: No  . Drug use: No  . Sexual activity: Yes  Other Topics Concern  . Not on file  Social History Narrative  . Not on file   Social Determinants of Health   Financial Resource Strain:   . Difficulty of Paying Living Expenses:   Food Insecurity:   . Worried About Charity fundraiser in the Last Year:   . Arboriculturist in the Last Year:   Transportation Needs:   . Film/video editor (Medical):   Marland Kitchen Lack of Transportation (Non-Medical):   Physical Activity:   . Days of Exercise per Week:   . Minutes of Exercise per Session:   Stress:   . Feeling of Stress :   Social Connections:   . Frequency of Communication with Friends and Family:   . Frequency of Social Gatherings with Friends and Family:   . Attends Religious Services:   . Active Member of Clubs or Organizations:   . Attends Archivist Meetings:   .  Marital Status:   Intimate Partner Violence:   . Fear of Current or Ex-Partner:   . Emotionally Abused:   Marland Kitchen Physically Abused:   . Sexually Abused:     Allergies:  No Known Allergies  Medications: Prior to Admission medications   Medication Sig Start Date End Date Taking? Authorizing Provider  benzonatate (TESSALON PERLES) 100 MG capsule Take 1 capsule (100 mg total) by mouth 3 (three) times daily as needed. Patient not taking: Reported on 01/12/2020 02/19/19   Chevis Pretty, FNP  fluticasone [facility]) 50 MCG/ACT nasal  spray Place 2 sprays into both nostrils daily. Patient not taking: Reported on 01/12/2020 02/19/19   Chevis Pretty, FNP  ibuprofen (ADVIL,MOTRIN) 600 MG tablet Take 1 tablet (600 mg total) by mouth every 6 (six) hours as needed for mild pain, moderate pain or cramping. Patient not taking: Reported on 01/12/2020 01/30/18   Dalia Heading, CNM  Norethindrone-Ethinyl Estradiol-Fe Biphas (LO LOESTRIN FE) 1 MG-10 MCG / 10 MCG tablet Take 1 tablet by mouth daily. Patient not taking: Reported on 01/12/2020 09/27/19   Malachy Mood, MD  prenatal vitamin w/FE, FA (PRENATAL 1 + 1) 27-1 MG TABS tablet Take 1 tablet by mouth daily at 12 noon.    [provider]  terbinafine (LAMISIL) 250 MG tablet Take 1 tablet (250 mg total) by mouth daily. Patient not taking: Reported on 01/12/2020 12/09/19   Edrick Kins, DPM    Physical Exam Vitals: Blood pressure 110/70, height 5\' 6"  (1.676 m), weight 154 lb (69.9 kg), not currently breastfeeding.  General: NAD HEENT: normocephalic, anicteric Thyroid: no enlargement, no palpable nodules Pulmonary: No increased work of breathing, CTAB Cardiovascular: RRR, distal pulses 2+ Breast: Breast symmetrical, no tenderness, no palpable nodules or masses, no skin or nipple retraction present, no nipple discharge.  No axillary or supraclavicular lymphadenopathy. Abdomen: NABS, soft, non-tender, non-distended.  Umbilicus without lesions.  No hepatomegaly, splenomegaly or masses palpable. No evidence of hernia  Genitourinary:  External: Normal external female genitalia.  Normal urethral meatus, normal Bartholin's and Skene's glands.    Vagina: Normal vaginal mucosa, no evidence of prolapse.    Cervix: Grossly normal in appearance, no bleeding  Uterus: Non-enlarged, mobile, normal contour.  No CMT  Adnexa: ovaries non-enlarged, no adnexal masses  Rectal: deferred  Lymphatic: no evidence of inguinal lymphadenopathy Extremities: no edema, erythema, or  tenderness Neurologic: Grossly intact Psychiatric: mood appropriate, affect full  Female chaperone present for pelvic and breast  portions of the physical exam    Assessment: 29 y.o. G2P2002 routine annual exam  Plan: Problem List Items Addressed This Visit      Other   Healthy adult on routine physical examination   Family history of colon cancer in father    Other Visit Diagnoses    Encounter for gynecological examination without abnormal finding    -  Primary   Screening for malignant neoplasm of cervix       Relevant Orders   Cytology - PAP      1) STI screening  was notoffered and therefore not obtained  2)  ASCCP guidelines and rational discussed.  Patient opts for every 3 years screening interval  3) Contraception - the patient is currently using  OCP (estrogen/progesterone).  She is happy with her current form of contraception and plans to continue  4) Routine healthcare maintenance including cholesterol, diabetes screening discussed managed by PCP  5) Return in about 1 year (around 01/11/2021) for annual.   Malachy Mood, MD, [facility]  OB/GYN, [facility] 01/12/2020, 8:58 AM

## 2020-01-13 LAB — CYTOLOGY - PAP: Diagnosis: NEGATIVE

## 2020-09-21 DIAGNOSIS — R0981 Nasal congestion: Secondary | ICD-10-CM | POA: Diagnosis not present

## 2020-09-21 DIAGNOSIS — Z20828 Contact with and (suspected) exposure to other viral communicable diseases: Secondary | ICD-10-CM | POA: Diagnosis not present

## 2020-10-11 DIAGNOSIS — Z20822 Contact with and (suspected) exposure to covid-19: Secondary | ICD-10-CM | POA: Diagnosis not present

## 2020-10-15 DIAGNOSIS — Z20822 Contact with and (suspected) exposure to covid-19: Secondary | ICD-10-CM | POA: Diagnosis not present

## 2020-10-22 DIAGNOSIS — Z20822 Contact with and (suspected) exposure to covid-19: Secondary | ICD-10-CM | POA: Diagnosis not present

## 2020-10-29 DIAGNOSIS — Z20822 Contact with and (suspected) exposure to covid-19: Secondary | ICD-10-CM | POA: Diagnosis not present

## 2020-11-05 DIAGNOSIS — Z20822 Contact with and (suspected) exposure to covid-19: Secondary | ICD-10-CM | POA: Diagnosis not present

## 2020-11-12 DIAGNOSIS — Z20822 Contact with and (suspected) exposure to covid-19: Secondary | ICD-10-CM | POA: Diagnosis not present

## 2020-11-19 DIAGNOSIS — Z20822 Contact with and (suspected) exposure to covid-19: Secondary | ICD-10-CM | POA: Diagnosis not present

## 2020-11-26 DIAGNOSIS — Z20822 Contact with and (suspected) exposure to covid-19: Secondary | ICD-10-CM | POA: Diagnosis not present

## 2020-12-03 DIAGNOSIS — Z20822 Contact with and (suspected) exposure to covid-19: Secondary | ICD-10-CM | POA: Diagnosis not present

## 2020-12-10 DIAGNOSIS — Z20822 Contact with and (suspected) exposure to covid-19: Secondary | ICD-10-CM | POA: Diagnosis not present

## 2020-12-17 DIAGNOSIS — Z20822 Contact with and (suspected) exposure to covid-19: Secondary | ICD-10-CM | POA: Diagnosis not present

## 2020-12-24 DIAGNOSIS — Z20822 Contact with and (suspected) exposure to covid-19: Secondary | ICD-10-CM | POA: Diagnosis not present

## 2020-12-27 ENCOUNTER — Other Ambulatory Visit: Payer: Self-pay | Admitting: Obstetrics and Gynecology

## 2021-01-01 DIAGNOSIS — Z20822 Contact with and (suspected) exposure to covid-19: Secondary | ICD-10-CM | POA: Diagnosis not present

## 2021-01-07 DIAGNOSIS — Z20822 Contact with and (suspected) exposure to covid-19: Secondary | ICD-10-CM | POA: Diagnosis not present

## 2021-01-14 DIAGNOSIS — Z20822 Contact with and (suspected) exposure to covid-19: Secondary | ICD-10-CM | POA: Diagnosis not present

## 2021-01-17 ENCOUNTER — Encounter: Payer: Self-pay | Admitting: Obstetrics and Gynecology

## 2021-01-17 ENCOUNTER — Ambulatory Visit (INDEPENDENT_AMBULATORY_CARE_PROVIDER_SITE_OTHER): Payer: BC Managed Care – PPO | Admitting: Obstetrics and Gynecology

## 2021-01-17 ENCOUNTER — Other Ambulatory Visit (HOSPITAL_COMMUNITY)
Admission: RE | Admit: 2021-01-17 | Discharge: 2021-01-17 | Disposition: A | Discharge status: Home / Self Care | Payer: BC Managed Care – PPO | Source: Ambulatory Visit | Attending: Obstetrics and Gynecology | Admitting: Obstetrics and Gynecology

## 2021-01-17 ENCOUNTER — Other Ambulatory Visit: Payer: Self-pay

## 2021-01-17 VITALS — BP 138/87 | HR 64 | Ht 66.0 in | Wt 160.0 lb

## 2021-01-17 DIAGNOSIS — Z1239 Encounter for other screening for malignant neoplasm of breast: Secondary | ICD-10-CM

## 2021-01-17 DIAGNOSIS — Z124 Encounter for screening for malignant neoplasm of cervix: Secondary | ICD-10-CM | POA: Diagnosis not present

## 2021-01-17 DIAGNOSIS — Z01419 Encounter for gynecological examination (general) (routine) without abnormal findings: Secondary | ICD-10-CM

## 2021-01-17 DIAGNOSIS — N930 Postcoital and contact bleeding: Secondary | ICD-10-CM

## 2021-01-17 DIAGNOSIS — Z1151 Encounter for screening for human papillomavirus (HPV): Secondary | ICD-10-CM | POA: Diagnosis not present

## 2021-01-17 DIAGNOSIS — N86 Erosion and ectropion of cervix uteri: Secondary | ICD-10-CM

## 2021-01-17 DIAGNOSIS — Z3041 Encounter for surveillance of contraceptive pills: Secondary | ICD-10-CM

## 2021-01-17 HISTORY — DX: Erosion and ectropion of cervix uteri: N86

## 2021-01-17 MED ORDER — LO LOESTRIN FE 1 MG-10 MCG / 10 MCG PO TABS: 1.0000 | ORAL_TABLET | Freq: Every day | ORAL | 3 refills | Status: DC

## 2021-01-17 NOTE — Progress Notes (Signed)
Gynecology Annual Exam   PCP: Patient, No Pcp Per (Inactive)  Chief Complaint: No chief complaint on file.   History of Present Illness: Patient is a 30 y.o. [id number] presents for annual exam. The patient has no complaints today.   LMP: No LMP recorded. Amenorrhea on OCP.     The patient is sexually active. She currently uses OCP (estrogen/progesterone) for contraception. She admits to dyspareunia.  A month ago had postcoital spotting in the past month.  Some cramping afterwards.  The patient does perform self breast exams.  There is no notable family history of breast or ovarian cancer in her family. Dad history of colon.  The patient wears seatbelts: yes.   The patient has regular exercise: no.    The patient denies current symptoms of depression.    Review of Systems: Review of Systems  Constitutional: Negative for chills and fever.  HENT: Negative for congestion.   Respiratory: Negative for cough and shortness of breath.   Cardiovascular: Negative for chest pain and palpitations.  Gastrointestinal: Negative for abdominal pain, constipation, diarrhea, heartburn, nausea and vomiting.  Genitourinary: Negative for dysuria, frequency and urgency.  Skin: Negative for itching and rash.  Neurological: Negative for dizziness and headaches.  Endo/Heme/Allergies: Negative for polydipsia.  Psychiatric/Behavioral: Negative for depression.    Past Medical History:  Patient Active Problem List   Diagnosis Date Noted  . Healthy adult on routine physical examination 01/12/2020  . Family history of colon cancer in father 01/12/2020    Diagnosed at age 80     Past Surgical History:  Past Surgical History:  Procedure Laterality Date  . BREAST BIOPSY  2013   left  . WISDOM TOOTH EXTRACTION  2009    Gynecologic History:  No LMP recorded. Contraception: OCP (estrogen/progesterone) Last Pap: Results were: 01/12/2020 no abnormalities   Obstetric History: [id number]  Family  History:  Family History  Problem Relation Age of Onset  . Hypertension Father   . Colon cancer Father 14  . Hypertension Paternal Grandfather     Social History:  Social History   Socioeconomic History  . Marital status: Single    Spouse name: Not on file  . Number of children: Not on file  . Years of education: Not on file  . Highest education level: Not on file  Occupational History  . Not on file  Tobacco Use  . Smoking status: Never Smoker  . Smokeless tobacco: Never Used  Vaping Use  . Vaping Use: Never used  Substance and Sexual Activity  . Alcohol use: No  . Drug use: No  . Sexual activity: Yes  Other Topics Concern  . Not on file  Social History Narrative  . Not on file   Social Determinants of Health   Financial Resource Strain: Not on file  Food Insecurity: Not on file  Transportation Needs: Not on file  Physical Activity: Not on file  Stress: Not on file  Social Connections: Not on file  Intimate Partner Violence: Not on file    Allergies:  No Known Allergies  Medications: Prior to Admission medications   Medication Sig Start Date End Date Taking? Authorizing Provider  LO LOESTRIN FE 1 MG-10 MCG / 10 MCG tablet TAKE 1 TABLET BY MOUTH EVERY DAY 12/27/20   Malachy Mood, MD  prenatal vitamin w/FE, FA (PRENATAL 1 + 1) 27-1 MG TABS tablet Take 1 tablet by mouth daily at 12 noon. 01/12/20   Malachy Mood, MD    Physical  Exam Vitals: Blood pressure 138/87, pulse 64, height 5\' 6"  (1.676 m), weight 160 lb (72.6 kg), unknown if currently breastfeeding.   General: NAD HEENT: normocephalic, anicteric Thyroid: no enlargement, no palpable nodules Pulmonary: No increased work of breathing, CTAB Cardiovascular: RRR, distal pulses 2+ Breast: Breast symmetrical, no tenderness, no palpable nodules or masses, no skin or nipple retraction present, no nipple discharge.  No axillary or supraclavicular lymphadenopathy. Abdomen: NABS, soft, non-tender,  non-distended.  Umbilicus without lesions.  No hepatomegaly, splenomegaly or masses palpable. No evidence of hernia  Genitourinary:  External: Normal external female genitalia.  Normal urethral meatus, normal Bartholin's and Skene's glands.    Vagina: Normal vaginal mucosa, no evidence of prolapse.    Cervix: Grossly normal in appearance, prominent anterior cervical ectropion with reproducible contact bleeding.  Uterus: Non-enlarged, mobile, normal contour.  No CMT  Adnexa: ovaries non-enlarged, no adnexal masses  Rectal: deferred  Lymphatic: no evidence of inguinal lymphadenopathy Extremities: no edema, erythema, or tenderness Neurologic: Grossly intact Psychiatric: mood appropriate, affect full  Female chaperone present for pelvic and breast  portions of the physical exam    Assessment: 30 y.o. [id number] routine annual exam  Plan: Problem List Items Addressed This Visit      Genitourinary   Cervical ectropion   Relevant Orders   Cytology - PAP    Other Visit Diagnoses    Encounter for gynecological examination without abnormal finding    -  Primary   Breast screening       Screening for malignant neoplasm of cervix       Postcoital and contact bleeding       Oral contraceptive pill surveillance          1) STI screening  was notoffered and declined  2)  ASCCP guidelines and rational discussed.  Patient opts for every 3 years screening interval  3) Contraception - the patient is currently using  OCP (estrogen/progesterone).  She is happy with her current form of contraception and plans to continue  4) Routine healthcare maintenance including cholesterol, diabetes screening discussed managed by PCP  5) Cervical ectropion - discussed benign nature but if continued bleeding options include LEEP or cryo therapy to remove.  6) Return in about 1 year (around 01/17/2022) for annual.   Malachy Mood, MD, [place], [facility] 01/17/2021, 8:21  AM

## 2021-01-21 DIAGNOSIS — Z20822 Contact with and (suspected) exposure to covid-19: Secondary | ICD-10-CM | POA: Diagnosis not present

## 2021-01-22 ENCOUNTER — Ambulatory Visit: Payer: BC Managed Care – PPO | Admitting: Obstetrics and Gynecology

## 2021-01-22 LAB — CYTOLOGY - PAP
Comment: NEGATIVE
Diagnosis: NEGATIVE
High risk HPV: NEGATIVE

## 2021-01-29 DIAGNOSIS — Z20822 Contact with and (suspected) exposure to covid-19: Secondary | ICD-10-CM | POA: Diagnosis not present

## 2021-02-04 DIAGNOSIS — Z20822 Contact with and (suspected) exposure to covid-19: Secondary | ICD-10-CM | POA: Diagnosis not present

## 2021-02-11 DIAGNOSIS — Z20822 Contact with and (suspected) exposure to covid-19: Secondary | ICD-10-CM | POA: Diagnosis not present

## 2021-02-18 DIAGNOSIS — Z20828 Contact with and (suspected) exposure to other viral communicable diseases: Secondary | ICD-10-CM | POA: Diagnosis not present

## 2021-02-25 DIAGNOSIS — Z20822 Contact with and (suspected) exposure to covid-19: Secondary | ICD-10-CM | POA: Diagnosis not present

## 2021-03-01 ENCOUNTER — Other Ambulatory Visit: Payer: Self-pay | Admitting: Obstetrics and Gynecology

## 2021-03-01 MED ORDER — BUPROPION HCL ER (XL) 150 MG PO TB24: 150.0000 mg | ORAL_TABLET | Freq: Every day | ORAL | 2 refills | Status: DC

## 2021-03-01 NOTE — Progress Notes (Signed)
Reports symptoms of depression.  No SI/HI.  Sent PHQ-9 and GAD-7 to fill out.  Will start Wellbutrin XL 150mg  po daily, two week follow up to assess response.

## 2021-03-05 DIAGNOSIS — Z20822 Contact with and (suspected) exposure to covid-19: Secondary | ICD-10-CM | POA: Diagnosis not present

## 2021-03-11 DIAGNOSIS — Z20822 Contact with and (suspected) exposure to covid-19: Secondary | ICD-10-CM | POA: Diagnosis not present

## 2021-03-18 DIAGNOSIS — Z20822 Contact with and (suspected) exposure to covid-19: Secondary | ICD-10-CM | POA: Diagnosis not present

## 2021-03-21 ENCOUNTER — Other Ambulatory Visit: Payer: Self-pay

## 2021-03-21 ENCOUNTER — Encounter: Payer: Self-pay | Admitting: Obstetrics and Gynecology

## 2021-03-21 ENCOUNTER — Ambulatory Visit (INDEPENDENT_AMBULATORY_CARE_PROVIDER_SITE_OTHER): Payer: BC Managed Care – PPO | Admitting: Obstetrics and Gynecology

## 2021-03-21 DIAGNOSIS — F419 Anxiety disorder, unspecified: Secondary | ICD-10-CM | POA: Diagnosis not present

## 2021-03-21 DIAGNOSIS — F32A Depression, unspecified: Secondary | ICD-10-CM | POA: Diagnosis not present

## 2021-03-21 MED ORDER — BUPROPION HCL ER (XL) 300 MG PO TB24: 300.0000 mg | ORAL_TABLET | Freq: Every day | ORAL | 2 refills | Status: DC

## 2021-03-21 MED ORDER — HYDROXYZINE HCL 25 MG PO TABS: 25.0000 mg | ORAL_TABLET | Freq: Four times a day (QID) | ORAL | 2 refills | Status: DC | PRN

## 2021-03-21 NOTE — Progress Notes (Signed)
I connected with Candace Higgins on 03/21/2021 at  3:50 PM EDT by telephone and verified that I am speaking with the correct person using two identifiers.   I discussed the limitations, risks, security and privacy concerns of performing an evaluation and management service by telephone and the availability of in person appointments. I also discussed with the patient that there may be a patient responsible charge related to this service. The patient expressed understanding and agreed to proceed.  The patient was at home I spoke with the patient from my workstation phone The names of people involved in this encounter were: Sabino Dick , and Malachy Mood   Obstetrics & Gynecology Office Visit   Chief Complaint:  Chief Complaint  Patient presents with   Follow-up    Medication    History of Present Illness: The patient is a 30 y.o. female presenting follow up for symptoms of anxiety and depression.  The patient is currently taking Wellbutrin XL 150mg  for the management of her symptoms.  She has had recent situational stressors.  She reports symptoms of anhedonia, day time somnolence, insomnia, irritability, social anxiety, feelings of guilt, and feelings of worthlessness.  She denies risk taking behavior, agorophobia, suicidal ideation, homicidal ideation, auditory hallucinations, and visual hallucinations. Symptoms have remained unchanged since last visit.     The patient does not have a pre-existing history of depression and anxiety.  She  does not a prior history of suicide attempts.    Review of Systems: Review of Systems  Constitutional: Negative.   Gastrointestinal:  Positive for nausea.  Neurological:  Negative for headaches.  Psychiatric/Behavioral:  Positive for depression. Negative for hallucinations, substance abuse and suicidal ideas. The patient is nervous/anxious and has insomnia.     Past Medical History:  Past Medical History:  Diagnosis Date   Anxiety    Benign  neoplasm of breast 2013    Past Surgical History:  Past Surgical History:  Procedure Laterality Date   BREAST BIOPSY  2013   left   WISDOM TOOTH EXTRACTION  2009    Gynecologic History: No LMP recorded.  Obstetric History: G2P2002  Family History:  Family History  Problem Relation Age of Onset   Hypertension Father    Colon cancer Father 58   Hypertension Paternal Grandfather     Social History:  Social History   Socioeconomic History   Marital status: Married    Spouse name: Not on file   Number of children: Not on file   Years of education: Not on file   Highest education level: Not on file  Occupational History   Not on file  Tobacco Use   Smoking status: Never   Smokeless tobacco: Never  Vaping Use   Vaping Use: Never used  Substance and Sexual Activity   Alcohol use: No   Drug use: No   Sexual activity: Yes    Birth control/protection: Pill  Other Topics Concern   Not on file  Social History Narrative   Not on file   Social Determinants of Health   Financial Resource Strain: Not on file  Food Insecurity: Not on file  Transportation Needs: Not on file  Physical Activity: Not on file  Stress: Not on file  Social Connections: Not on file  Intimate Partner Violence: Not on file    Allergies:  No Known Allergies  Medications: Prior to Admission medications   Medication Sig Start Date End Date Taking? Authorizing Provider  hydrOXYzine (ATARAX/VISTARIL) 25 MG tablet  Take 1 tablet (25 mg total) by mouth every 6 (six) hours as needed for anxiety. 03/21/21  Yes Malachy Mood, MD  buPROPion (WELLBUTRIN XL) 300 MG 24 hr tablet Take 1 tablet (300 mg total) by mouth daily. 03/21/21   Malachy Mood, MD    Physical Exam Vitals: There were no vitals filed for this visit. No LMP recorded.  No physical exam as this was a remote telephone visit to promote social distancing during the current COVID-19 Pandemic   No flowsheet data found.  No  flowsheet data found.  No flowsheet data found.   Assessment: 30 y.o. [id number] follow up anxiety depression  Plan: Problem List Items Addressed This Visit   None   1) Anxiety/Depression - increase Wellbutrin XL to 300mg  po daily - has continued OCP  2) Thyroid and B12 screen has not been obtained previously  3) Telephone Time 13:71minues    Malachy Mood, MD, [place], [facility] 03/21/2021, 4:15 PM

## 2021-03-22 ENCOUNTER — Ambulatory Visit: Payer: BC Managed Care – PPO | Admitting: Obstetrics and Gynecology

## 2021-03-25 DIAGNOSIS — Z20822 Contact with and (suspected) exposure to covid-19: Secondary | ICD-10-CM | POA: Diagnosis not present

## 2021-04-01 DIAGNOSIS — Z20822 Contact with and (suspected) exposure to covid-19: Secondary | ICD-10-CM | POA: Diagnosis not present

## 2021-04-08 ENCOUNTER — Ambulatory Visit: Payer: BC Managed Care – PPO | Admitting: Obstetrics and Gynecology

## 2021-04-08 DIAGNOSIS — Z20822 Contact with and (suspected) exposure to covid-19: Secondary | ICD-10-CM | POA: Diagnosis not present

## 2021-04-13 ENCOUNTER — Other Ambulatory Visit: Payer: Self-pay | Admitting: Obstetrics and Gynecology

## 2021-04-15 DIAGNOSIS — Z20822 Contact with and (suspected) exposure to covid-19: Secondary | ICD-10-CM | POA: Diagnosis not present

## 2021-04-22 DIAGNOSIS — Z20822 Contact with and (suspected) exposure to covid-19: Secondary | ICD-10-CM | POA: Diagnosis not present

## 2021-04-25 ENCOUNTER — Telehealth: Payer: Self-pay

## 2021-04-25 NOTE — Telephone Encounter (Signed)
Patient called and said that she wants to set up a time for Staebler to remove the "polyp/fibroid" on her uterus. She said this is a procedure that can be done in clinic. I adv that I will notify AMS of her request and wait to hear back from him to schedule.

## 2021-04-29 DIAGNOSIS — Z20822 Contact with and (suspected) exposure to covid-19: Secondary | ICD-10-CM | POA: Diagnosis not present

## 2021-05-02 NOTE — Telephone Encounter (Signed)
Patient is scheduled for 05/24/21 with AMS

## 2021-05-07 ENCOUNTER — Other Ambulatory Visit: Payer: BC Managed Care – PPO

## 2021-05-07 DIAGNOSIS — Z20822 Contact with and (suspected) exposure to covid-19: Secondary | ICD-10-CM | POA: Diagnosis not present

## 2021-05-13 DIAGNOSIS — Z20822 Contact with and (suspected) exposure to covid-19: Secondary | ICD-10-CM | POA: Diagnosis not present

## 2021-05-14 DIAGNOSIS — R059 Cough, unspecified: Secondary | ICD-10-CM | POA: Diagnosis not present

## 2021-05-14 DIAGNOSIS — Z20828 Contact with and (suspected) exposure to other viral communicable diseases: Secondary | ICD-10-CM | POA: Diagnosis not present

## 2021-05-19 DIAGNOSIS — R059 Cough, unspecified: Secondary | ICD-10-CM | POA: Diagnosis not present

## 2021-05-19 DIAGNOSIS — Z20828 Contact with and (suspected) exposure to other viral communicable diseases: Secondary | ICD-10-CM | POA: Diagnosis not present

## 2021-05-24 ENCOUNTER — Ambulatory Visit: Payer: BC Managed Care – PPO | Admitting: Obstetrics and Gynecology

## 2021-06-06 ENCOUNTER — Ambulatory Visit
Admission: EM | Admit: 2021-06-06 | Discharge: 2021-06-06 | Disposition: A | Discharge status: Home / Self Care | Payer: BC Managed Care – PPO

## 2021-06-06 ENCOUNTER — Other Ambulatory Visit: Payer: Self-pay

## 2021-06-06 DIAGNOSIS — R1013 Epigastric pain: Secondary | ICD-10-CM | POA: Diagnosis not present

## 2021-06-07 DIAGNOSIS — K8020 Calculus of gallbladder without cholecystitis without obstruction: Secondary | ICD-10-CM | POA: Diagnosis not present

## 2021-06-07 DIAGNOSIS — K921 Melena: Secondary | ICD-10-CM | POA: Diagnosis not present

## 2021-06-07 DIAGNOSIS — R1031 Right lower quadrant pain: Secondary | ICD-10-CM | POA: Diagnosis not present

## 2021-06-07 DIAGNOSIS — N3001 Acute cystitis with hematuria: Secondary | ICD-10-CM | POA: Diagnosis not present

## 2021-06-07 DIAGNOSIS — R9341 Abnormal radiologic findings on diagnostic imaging of renal pelvis, ureter, or bladder: Secondary | ICD-10-CM | POA: Diagnosis not present

## 2021-06-07 DIAGNOSIS — R1030 Lower abdominal pain, unspecified: Secondary | ICD-10-CM | POA: Diagnosis not present

## 2021-06-07 DIAGNOSIS — K529 Noninfective gastroenteritis and colitis, unspecified: Secondary | ICD-10-CM | POA: Diagnosis not present

## 2021-06-07 DIAGNOSIS — Z6822 Body mass index (BMI) 22.0-22.9, adult: Secondary | ICD-10-CM | POA: Diagnosis not present

## 2021-06-07 DIAGNOSIS — R1010 Upper abdominal pain, unspecified: Secondary | ICD-10-CM | POA: Diagnosis not present

## 2021-06-24 ENCOUNTER — Ambulatory Visit: Payer: BC Managed Care – PPO | Admitting: Obstetrics and Gynecology

## 2021-07-18 ENCOUNTER — Other Ambulatory Visit: Payer: Self-pay

## 2021-07-18 ENCOUNTER — Encounter: Payer: Self-pay | Admitting: Obstetrics and Gynecology

## 2021-07-18 ENCOUNTER — Ambulatory Visit (INDEPENDENT_AMBULATORY_CARE_PROVIDER_SITE_OTHER): Payer: BC Managed Care – PPO | Admitting: Obstetrics and Gynecology

## 2021-07-18 ENCOUNTER — Other Ambulatory Visit: Payer: Self-pay | Admitting: Obstetrics and Gynecology

## 2021-07-18 VITALS — BP 120/72 | Ht 66.0 in | Wt 148.0 lb

## 2021-07-18 DIAGNOSIS — B3731 Acute candidiasis of vulva and vagina: Secondary | ICD-10-CM | POA: Diagnosis not present

## 2021-07-18 DIAGNOSIS — R399 Unspecified symptoms and signs involving the genitourinary system: Secondary | ICD-10-CM

## 2021-07-18 LAB — POCT WET PREP WITH KOH
Clue Cells Wet Prep HPF POC: NEGATIVE
KOH Prep POC: NEGATIVE
Trichomonas, UA: NEGATIVE
Yeast Wet Prep HPF POC: POSITIVE

## 2021-07-18 LAB — POCT URINALYSIS DIPSTICK
Bilirubin, UA: NEGATIVE
Glucose, UA: NEGATIVE
Ketones, UA: NEGATIVE
Nitrite, UA: NEGATIVE
Protein, UA: NEGATIVE
Spec Grav, UA: 1.02 (ref 1.010–1.025)
pH, UA: 6 (ref 5.0–8.0)

## 2021-07-18 MED ORDER — FLUCONAZOLE 150 MG PO TABS: 150.0000 mg | ORAL_TABLET | Freq: Once | ORAL | 0 refills | Status: AC

## 2021-07-18 MED ORDER — NITROFURANTOIN MONOHYD MACRO 100 MG PO CAPS: 100.0000 mg | ORAL_CAPSULE | Freq: Two times a day (BID) | ORAL | 0 refills | Status: AC

## 2021-07-18 NOTE — Progress Notes (Signed)
Patient, No Pcp Per (Inactive)   Chief Complaint  Patient presents with   Urinary Tract Infection    Frequency and painful urination, blood in urine x 6 days   Vaginal Exam    Pain during intercourse      HPI:      Ms. CATALYNA REILLY is a 30 y.o. [id number] whose LMP was No LMP recorded. (Menstrual status: Oral contraceptives)., presents today for UTI sx of frequency with decreased flow, dysuria, pelvic discomfort for 6 days, noticed blood in urine yesterday. No LBP, fevers. Had sx 06/07/21 at [facility] ED with pos UA/neg C&S, treated with cipro BID for 7 days for UTI and colitis. Sx resolved but have since recurred.  Also with increased vag d/c with itching, no fishy odor, for 6 days. No meds to treat.  She is sex active with some pain at her cx during sex, not unusual for pt. Hx of nabothian cyst. On Lo Loestin without periods, on placebo pills now.    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   Healthy adult on routine physical examination 01/12/2020   Family history of colon cancer in father 01/12/2020    Past Surgical History:  Procedure Laterality Date   BREAST BIOPSY  2013   left   WISDOM TOOTH EXTRACTION  2009    Family History  Problem Relation Age of Onset   Hypertension Father    Colon cancer Father 35   Hypertension Paternal Grandfather    Lung cancer Paternal Grandfather     Social History   Socioeconomic History   Marital status: Single    Spouse name: Not on file   Number of children: Not on file   Years of education: Not on file   Highest education level: Not on file  Occupational History   Not on file  Tobacco Use   Smoking status: Never   Smokeless tobacco: Never  Vaping Use   Vaping Use: Never used  Substance and Sexual Activity   Alcohol use: No   Drug use: No   Sexual activity: Yes    Birth control/protection: Pill  Other Topics Concern   Not on file  Social History Narrative   Not on file    Social Determinants of Health   Financial Resource Strain: Not on file  Food Insecurity: Not on file  Transportation Needs: Not on file  Physical Activity: Not on file  Stress: Not on file  Social Connections: Not on file  Intimate Partner Violence: Not on file    Outpatient Medications Prior to Visit  Medication Sig Dispense Refill   buPROPion (WELLBUTRIN XL) 300 MG 24 hr tablet TAKE 1 TABLET BY MOUTH EVERY DAY 90 tablet 1   hydrOXYzine (ATARAX/VISTARIL) 25 MG tablet Take 1 tablet (25 mg total) by mouth every 6 (six) hours as needed for anxiety. 30 tablet 2   LO LOESTRIN FE 1 MG-10 MCG / 10 MCG tablet Take 1 tablet by mouth daily.     No facility-administered medications prior to visit.      ROS:  Review of Systems  Constitutional:  Negative for fever.  Gastrointestinal:  Negative for blood in stool, constipation, diarrhea, nausea and vomiting.  Genitourinary:  Positive for dysuria, frequency, urgency, vaginal bleeding and vaginal discharge. Negative for dyspareunia, flank pain, hematuria and vaginal pain.  Musculoskeletal:  Negative for back pain.  Skin:  Negative for rash.  BREAST: No symptoms   OBJECTIVE:  Vitals:  BP 120/72   Ht 5\' 6"  (1.676 m)   Wt 148 lb (67.1 kg)   Breastfeeding No   BMI 23.89 kg/m   Physical Exam Vitals reviewed.  Constitutional:      Appearance: She is well-developed.  Pulmonary:     Effort: Pulmonary effort is normal.  Genitourinary:    Pubic Area: No rash.      Labia:        Right: No rash, tenderness or lesion.        Left: No rash, tenderness or lesion.      Vagina: Bleeding present. No vaginal discharge, erythema or tenderness.     Cervix: Normal.     Uterus: Normal. Not enlarged and not tender.      Adnexa: Right adnexa normal and left adnexa normal.       Right: No mass or tenderness.         Left: No mass or tenderness.       Comments: MILD ERYTHEMA AT INTROITUS;  SCANT AMT BLOOD AT CX OS Musculoskeletal:         General: Normal range of motion.     Cervical back: Normal range of motion.  Skin:    General: Skin is warm and dry.  Neurological:     General: No focal deficit present.     Mental Status: She is alert and oriented to person, place, and time.  Psychiatric:        Mood and Affect: Mood normal.        Behavior: Behavior normal.        Thought Content: Thought content normal.        Judgment: Judgment normal.    Results: Results for orders placed or performed in visit on 07/18/21 (from the past 24 hour(s))  POCT Urinalysis Dipstick     Status: Abnormal   Collection Time: 07/18/21 10:03 AM  Result Value Ref Range   Color, UA yellow    Clarity, UA cloudy    Glucose, UA Negative Negative   Bilirubin, UA neg    Ketones, UA neg    Spec Grav, UA 1.020 1.010 - 1.025   Blood, UA small    pH, UA 6.0 5.0 - 8.0   Protein, UA Negative Negative   Urobilinogen, UA     Nitrite, UA neg    Leukocytes, UA Moderate (2+) (A) Negative   Appearance     Odor    POCT Wet Prep with KOH     Status: Abnormal   Collection Time: 07/18/21 10:04 AM  Result Value Ref Range   Trichomonas, UA Negative    Clue Cells Wet Prep HPF POC neg    Epithelial Wet Prep HPF POC     Yeast Wet Prep HPF POC pos    Bacteria Wet Prep HPF POC     RBC Wet Prep HPF POC     WBC Wet Prep HPF POC     KOH Prep POC Negative Negative   MENSTRUAL BLOOD ON EXAM  Assessment/Plan: Candidal vaginitis - Plan: fluconazole (DIFLUCAN) 150 MG tablet, POCT Wet Prep with KOH; pos sx and wet prep. Rx diflucan. F/u prn.   UTI symptoms - Plan: nitrofurantoin, macrocrystal-monohydrate, (MACROBID) 100 MG capsule, POCT Urinalysis Dipstick, Urine Culture; pos sx and UA; had neg C&S 10/22 with pos UA and sx. Check C&S. Rx macrobid. If C&S neg, will refer to urology for further eval.     Meds ordered this encounter  Medications   fluconazole (  DIFLUCAN) 150 MG tablet    Sig: Take 1 tablet (150 mg total) by mouth once for 1 dose. May repeat in  3 days if still having symptoms    Dispense:  2 tablet    Refill:  0    Order Specific Question:   Supervising Provider    Answer:   Gae Dry [[id number]]   nitrofurantoin, macrocrystal-monohydrate, (MACROBID) 100 MG capsule    Sig: Take 1 capsule (100 mg total) by mouth 2 (two) times daily for 5 days.    Dispense:  10 capsule    Refill:  0    Order Specific Question:   Supervising Provider    Answer:   Gae Dry [[id number]]      Return if symptoms worsen or fail to improve.  Jayliani Wanner B. Chancey Cullinane, PA-C 07/18/2021 10:06 AM

## 2021-07-22 LAB — URINE CULTURE

## 2021-07-24 NOTE — Addendum Note (Signed)
Addended by: Ardeth Perfect B on: 28/67/5198 05:54 AM   Modules accepted: Orders

## 2021-08-08 ENCOUNTER — Ambulatory Visit: Payer: BC Managed Care – PPO | Admitting: Urology

## 2021-08-09 ENCOUNTER — Encounter: Payer: Self-pay | Admitting: Urology

## 2022-02-12 NOTE — Progress Notes (Signed)
PCP:  Patient, No Pcp Per   Chief Complaint  Patient presents with   Gynecologic Exam    No concerns     HPI:      [name] is a 31 y.o. [id number] whose LMP was No LMP recorded. (Menstrual status: Oral contraceptives)., presents today for her annual examination.  Her menses are absent on OCPs, takes placebo pills. Occas mild dysmen.   Sex activity: single partner, contraception - OCP (estrogen/progesterone). Occas pain with nabothian cyst; no bleeding. Last Pap: 01/17/21 Results were: no abnormalities /neg HPV DNA. No hx of abn paps.  There is no FH of breast cancer. There is no FH of ovarian cancer. Hx of benign breast mass in distant past with neg eval; doesn't remember side. No change in size. The patient does do self-breast exams. Has FH colon cancer in her dad at age 33; will need colonscopy starting age 30.   Tobacco use: The patient denies current or previous tobacco use. Alcohol use: social drinker No drug use.  Exercise: moderately active  She does get adequate calcium and Vitamin D in her diet.  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   Healthy adult on routine physical examination 01/12/2020   Family history of colon cancer in father 01/12/2020    Past Surgical History:  Procedure Laterality Date   BREAST BIOPSY  2013   left   WISDOM TOOTH EXTRACTION  2009    Family History  Problem Relation Age of Onset   Hypertension Father    Colon cancer Father 34   Hypertension Paternal Grandfather    Lung cancer Paternal Grandfather     Social History   Socioeconomic History   Marital status: Single    Spouse name: Not on file   Number of children: Not on file   Years of education: Not on file   Highest education level: Not on file  Occupational History   Not on file  Tobacco Use   Smoking status: Never   Smokeless tobacco: Never  Vaping Use   Vaping Use: Never used  Substance and Sexual Activity    Alcohol use: No   Drug use: No   Sexual activity: Yes    Birth control/protection: Pill  Other Topics Concern   Not on file  Social History Narrative   Not on file   Social Determinants of Health   Financial Resource Strain: Not on file  Food Insecurity: Not on file  Transportation Needs: Not on file  Physical Activity: Not on file  Stress: Not on file  Social Connections: Not on file  Intimate Partner Violence: Not on file     Current Outpatient Medications:    LO LOESTRIN FE 1 MG-10 MCG / 10 MCG tablet, Take 1 tablet by mouth daily., Disp: 84 tablet, Rfl: 3     ROS:  Review of Systems  Constitutional:  Negative for fatigue, fever and unexpected weight change.  Respiratory:  Negative for cough, shortness of breath and wheezing.   Cardiovascular:  Negative for chest pain, palpitations and leg swelling.  Gastrointestinal:  Negative for blood in stool, constipation, diarrhea, nausea and vomiting.  Endocrine: Negative for cold intolerance, heat intolerance and polyuria.  Genitourinary:  Negative for dyspareunia, dysuria, flank pain, frequency, genital sores, hematuria, menstrual problem, pelvic pain, urgency, vaginal bleeding, vaginal discharge and vaginal pain.  Musculoskeletal:  Negative for back pain, joint swelling and myalgias.  Skin:  Negative for rash.  Neurological:  Negative for dizziness, syncope, light-headedness, numbness and headaches.  Hematological:  Negative for adenopathy.  Psychiatric/Behavioral:  Negative for agitation, confusion, sleep disturbance and suicidal ideas. The patient is not nervous/anxious.   BREAST: No symptoms   Objective: BP 120/80   Ht '5\' 6"'$  (1.676 m)   Wt 162 lb (73.5 kg)   BMI 26.15 kg/m    Physical Exam Constitutional:      Appearance: She is well-developed.  Genitourinary:     Vulva normal.     Right Labia: No rash, tenderness or lesions.    Left Labia: No tenderness, lesions or rash.    No vaginal discharge, erythema or  tenderness.      Right Adnexa: not tender and no mass present.    Left Adnexa: not tender and no mass present.    No cervical friability or polyp.     Uterus is not enlarged or tender.  Breasts:    Right: No mass, nipple discharge, skin change or tenderness.     Left: No mass, nipple discharge, skin change or tenderness.  Neck:     Thyroid: No thyromegaly.  Cardiovascular:     Rate and Rhythm: Normal rate and regular rhythm.     Heart sounds: Normal heart sounds. No murmur heard. Pulmonary:     Effort: Pulmonary effort is normal.     Breath sounds: Normal breath sounds.  Abdominal:     Palpations: Abdomen is soft.     Tenderness: There is no abdominal tenderness. There is no guarding or rebound.  Musculoskeletal:        General: Normal range of motion.     Cervical back: Normal range of motion.  Lymphadenopathy:     Cervical: No cervical adenopathy.  Neurological:     General: No focal deficit present.     Mental Status: She is alert and oriented to person, place, and time.     Cranial Nerves: No cranial nerve deficit.  Skin:    General: Skin is warm and dry.  Psychiatric:        Mood and Affect: Mood normal.        Behavior: Behavior normal.        Thought Content: Thought content normal.        Judgment: Judgment normal.  Vitals reviewed.    Assessment/Plan: Encounter for annual routine gynecological examination  Encounter for surveillance of contraceptive pills - Plan: LO LOESTRIN FE 1 MG-10 MCG / 10 MCG tablet; OCP RF  Meds ordered this encounter  Medications   LO LOESTRIN FE 1 MG-10 MCG / 10 MCG tablet    Sig: Take 1 tablet by mouth daily.    Dispense:  84 tablet    Refill:  3    Order Specific Question:   Supervising Provider    Answer:   CONSTANT, PEGGY [[id number]]             GYN counsel adequate intake of calcium and vitamin D, diet and exercise     F/U  Return in about 1 year (around 02/14/2023).  Jontez Redfield B. Ticara Waner, PA-C 02/13/2022 3:24 PM

## 2022-02-13 ENCOUNTER — Ambulatory Visit (INDEPENDENT_AMBULATORY_CARE_PROVIDER_SITE_OTHER): Payer: BC Managed Care – PPO | Admitting: Obstetrics and Gynecology

## 2022-02-13 ENCOUNTER — Encounter: Payer: Self-pay | Admitting: Obstetrics and Gynecology

## 2022-02-13 VITALS — BP 120/80 | Ht 66.0 in | Wt 162.0 lb

## 2022-02-13 DIAGNOSIS — Z01419 Encounter for gynecological examination (general) (routine) without abnormal findings: Secondary | ICD-10-CM | POA: Diagnosis not present

## 2022-02-13 DIAGNOSIS — Z3041 Encounter for surveillance of contraceptive pills: Secondary | ICD-10-CM | POA: Diagnosis not present

## 2022-02-13 MED ORDER — LO LOESTRIN FE 1 MG-10 MCG / 10 MCG PO TABS: 1.0000 | ORAL_TABLET | Freq: Every day | ORAL | 3 refills | Status: DC

## 2022-02-13 NOTE — Patient Instructions (Signed)
I value your feedback and you entrusting us with your care. If you get a [facility] patient survey, I would appreciate you taking the time to let us know about your experience today. Thank you! ? ? ?

## 2022-03-14 ENCOUNTER — Ambulatory Visit (INDEPENDENT_AMBULATORY_CARE_PROVIDER_SITE_OTHER): Payer: BC Managed Care – PPO | Admitting: Podiatry

## 2022-03-14 DIAGNOSIS — L603 Nail dystrophy: Secondary | ICD-10-CM | POA: Diagnosis not present

## 2022-03-14 DIAGNOSIS — A499 Bacterial infection, unspecified: Secondary | ICD-10-CM | POA: Diagnosis not present

## 2022-03-14 DIAGNOSIS — L608 Other nail disorders: Secondary | ICD-10-CM | POA: Diagnosis not present

## 2022-03-14 HISTORY — PX: OTHER SURGICAL HISTORY: SHX169

## 2022-03-25 DIAGNOSIS — J069 Acute upper respiratory infection, unspecified: Secondary | ICD-10-CM | POA: Diagnosis not present

## 2022-03-25 DIAGNOSIS — B9689 Other specified bacterial agents as the cause of diseases classified elsewhere: Secondary | ICD-10-CM | POA: Diagnosis not present

## 2022-03-27 NOTE — Progress Notes (Signed)
   Chief Complaint  Patient presents with   Nail Problem    Nail problem     HPI: 31 y.o. female presenting today for new complaint of pain and tenderness associated to the nail plate of the left second digit.  She is concerned for possible infection.  She presents to discuss possible removal of the toenail plate.  She has been applying some topical that was suggested years ago on [place] with minimal improvement. Patient does state that several years ago she did drop a piece of furniture on her left second toe and that is when the nail became disfigured.  She is concerned for possible permanent nail damage versus fungal nail infection.  Past Medical History:  Diagnosis Date   Anxiety    Benign neoplasm of breast 2013    Past Surgical History:  Procedure Laterality Date   BREAST BIOPSY  2013   left   WISDOM TOOTH EXTRACTION  2009    No Known Allergies   Physical Exam: General: The patient is alert and oriented x3 in no acute distress.  Dermatology: Skin is warm, dry and supple bilateral lower extremities. Negative for open lesions or macerations.  Hyperkeratotic dystrophic nail noted to the left second toenail plate  Vascular: Palpable pedal pulses bilaterally. Capillary refill within normal limits.  Negative for any significant edema or erythema  Neurological: Light touch and protective threshold grossly intact  Musculoskeletal Exam: No pedal deformities noted   Assessment: 1.  Nail dystrophy LT second toe.  Permanent trauma VS.  Fungal nail infection   Plan of Care:  1. Patient evaluated.  2.  Before treating the patient with antifungal medication and pursuing antifungals I do recommend a nail biopsy.  I discussed permanent damage to the nail matrix secondary to the given history of injury versus fungal nail infection.  I explained to the patient that I can treat fungus however I cannot treat permanent nail damage secondary to injury.  Patient understands.  Nail biopsy  was performed of the left second digit and sent to pathology for fungal culture 3.  We will call the patient with the results.  If the toenail is dystrophic mostly due to given history of trauma she is open to the idea of a total permanent nail avulsion.  We will discuss this over the phone.  *Works at [employer] M. Rolande Moe, DPM [facility]  Dr. Edrick Kins, DPM    [address], [place] [address]                Office [phone]  Fax [phone]

## 2022-04-01 ENCOUNTER — Encounter: Payer: Self-pay | Admitting: *Deleted

## 2022-04-08 NOTE — Progress Notes (Signed)
Tried calling patient to review the nail biopsy results.  No answer.  Voicemail full.  Candace Higgins or Candace Higgins could you please try and reach out to the patient?  You can tell her that the nail biopsy was negative for fungus.  If she would like to permanently remove the toenail she can schedule an appointment.  Thanks so much, Dr. Amalia Hailey

## 2022-05-08 ENCOUNTER — Other Ambulatory Visit: Payer: Self-pay | Admitting: Obstetrics and Gynecology

## 2022-05-08 ENCOUNTER — Encounter: Payer: Self-pay | Admitting: Obstetrics and Gynecology

## 2022-05-08 DIAGNOSIS — Z3041 Encounter for surveillance of contraceptive pills: Secondary | ICD-10-CM

## 2022-05-09 ENCOUNTER — Encounter: Payer: Self-pay | Admitting: Obstetrics and Gynecology

## 2022-05-11 ENCOUNTER — Other Ambulatory Visit: Payer: Self-pay | Admitting: Obstetrics and Gynecology

## 2022-05-11 DIAGNOSIS — Z3041 Encounter for surveillance of contraceptive pills: Secondary | ICD-10-CM

## 2022-05-11 MED ORDER — MICROGESTIN 24 FE 1-20 MG-MCG PO TABS: 1.0000 | ORAL_TABLET | Freq: Every day | ORAL | 2 refills | Status: DC

## 2022-07-09 DIAGNOSIS — Z20828 Contact with and (suspected) exposure to other viral communicable diseases: Secondary | ICD-10-CM | POA: Diagnosis not present

## 2022-09-11 ENCOUNTER — Ambulatory Visit
Admission: EM | Admit: 2022-09-11 | Discharge: 2022-09-11 | Disposition: A | Discharge status: Home / Self Care | Payer: BC Managed Care – PPO | Attending: Urgent Care | Admitting: Urgent Care

## 2022-09-11 DIAGNOSIS — B9689 Other specified bacterial agents as the cause of diseases classified elsewhere: Secondary | ICD-10-CM

## 2022-09-11 DIAGNOSIS — J028 Acute pharyngitis due to other specified organisms: Secondary | ICD-10-CM

## 2022-09-11 LAB — POCT RAPID STREP A (OFFICE): Rapid Strep A Screen: POSITIVE — AB

## 2022-09-11 MED ORDER — AMOXICILLIN 500 MG PO CAPS: 500.0000 mg | ORAL_CAPSULE | Freq: Two times a day (BID) | ORAL | 0 refills | Status: DC

## 2022-09-11 NOTE — ED Triage Notes (Signed)
Pt. Presents to UC w/ c/o a sore throat for the past 4 days.

## 2022-09-11 NOTE — Discharge Instructions (Signed)
Follow up here or with your primary care provider if your symptoms are worsening or not improving with treatment.     

## 2022-09-11 NOTE — ED Provider Notes (Signed)
[facility]    CSN: [id number] Arrival date & time: 09/11/22  0802      History   Chief Complaint Chief Complaint  Patient presents with   Sore Throat    HPI Candace Higgins is a 32 y.o. female.    Sore Throat    Presents to UC with c/o 4 day hx of sore throat.   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 Authorizing Provider  Norethindrone Acetate-Ethinyl Estrad-FE (MICROGESTIN 24 FE) 1-20 MG-MCG(24) tablet Take 1 tablet by mouth daily. 0/17/49   Copland, Deirdre Evener, PA-C    Family History Family History  Problem Relation Age of Onset   Hypertension Father    Colon cancer Father 3   Hypertension Paternal Grandfather    Lung cancer Paternal Grandfather     Social History Social History   Tobacco Use   Smoking status: Never   Smokeless tobacco: Never  Vaping Use   Vaping Use: Never used  Substance Use Topics   Alcohol use: No   Drug use: No     Allergies   Patient has no known allergies.   Review of Systems Review of Systems   Physical Exam Triage Vital Signs ED Triage Vitals [09/11/22 0812]  Enc Vitals Group     BP 108/76     Pulse      Resp 17     Temp 98 F (36.7 C)     Temp src      SpO2 98 %     Weight      Height      Head  Circumference      Peak Flow      Pain Score 0     Pain Loc      Pain Edu?      Excl. in [place]?    No data found.  Updated Vital Signs BP 108/76   Temp 98 F (36.7 C)   Resp 17   LMP  (LMP Unknown)   SpO2 98%   Visual Acuity Right Eye Distance:   Left Eye Distance:   Bilateral Distance:    Right Eye Near:   Left Eye Near:    Bilateral Near:     Physical Exam Vitals reviewed.  Constitutional:      Appearance: She is well-developed.  HENT:     Mouth/Throat:     Pharynx: Posterior oropharyngeal erythema present. No oropharyngeal  exudate.  Skin:    General: Skin is warm and dry.  Neurological:     General: No focal deficit present.     Mental Status: She is alert and oriented to person, place, and time.  Psychiatric:        Mood and Affect: Mood normal.        Behavior: Behavior normal.      UC Treatments / Results  Labs (all labs ordered are listed, but only abnormal results are displayed) Labs Reviewed - No data to display  EKG   Radiology No results found.  Procedures Procedures (including critical care time)  Medications Ordered in UC Medications - No data to display  Initial Impression / Assessment and Plan / UC Course  I have reviewed the triage vital signs and the nursing notes.  Pertinent labs & imaging results that were available during my care of the patient were reviewed by me and considered in my medical decision making (see chart for details).   Rapid strep is positive. Will treat with amoxil.   Final Clinical Impressions(s) / UC Diagnoses   Final diagnoses:  None   Discharge Instructions   None    ED Prescriptions   None    PDMP not reviewed this encounter.   Rose Phi, [place] 09/11/22 [phone]

## 2022-09-17 ENCOUNTER — Encounter: Payer: Self-pay | Admitting: Obstetrics and Gynecology

## 2022-09-19 ENCOUNTER — Ambulatory Visit: Payer: BC Managed Care – PPO

## 2022-09-19 ENCOUNTER — Other Ambulatory Visit (HOSPITAL_COMMUNITY)
Admission: RE | Admit: 2022-09-19 | Discharge: 2022-09-19 | Disposition: A | Discharge status: Home / Self Care | Payer: BC Managed Care – PPO | Source: Ambulatory Visit | Attending: Obstetrics and Gynecology | Admitting: Obstetrics and Gynecology

## 2022-09-19 VITALS — BP 125/84 | HR 76 | Ht 66.0 in | Wt 174.0 lb

## 2022-09-19 DIAGNOSIS — N898 Other specified noninflammatory disorders of vagina: Secondary | ICD-10-CM

## 2022-09-19 NOTE — Progress Notes (Signed)
    NURSE VISIT NOTE  Subjective:    Patient ID: Candace Higgins, female    DOB: May 04, 1991, 32 y.o.   MRN: [id number]  HPI  Patient is a 32 y.o. G26P2002 female who presents for white, creamy, milky, mucoid, odorless, and thick vaginal discharge for 1 week(s). Denies abnormal vaginal bleeding or significant pelvic pain or fever. Has had some genital irritation. Patient denies history of known exposure to STD.   Objective:        Plan:   Treatment: OTC yeast cream such as Monistat or Gyne-Lotrimin and abstain from coitus during course of treatment ROV prn if symptoms persist or worsen.   Landis Gandy, [place]

## 2022-09-22 LAB — CERVICOVAGINAL ANCILLARY ONLY
Bacterial Vaginitis (gardnerella): POSITIVE — AB
Candida Glabrata: NEGATIVE
Candida Vaginitis: POSITIVE — AB
Comment: NEGATIVE
Comment: NEGATIVE
Comment: NEGATIVE

## 2022-09-22 MED ORDER — FLUCONAZOLE 150 MG PO TABS: 150.0000 mg | ORAL_TABLET | Freq: Once | ORAL | 0 refills | Status: AC

## 2022-09-22 MED ORDER — METRONIDAZOLE 500 MG PO TABS: ORAL_TABLET | ORAL | 0 refills | Status: DC

## 2022-09-22 NOTE — Addendum Note (Signed)
Addended by: Ardeth Perfect B on: 2/77/8242 04:23 PM   Modules accepted: Orders

## 2022-09-29 NOTE — Progress Notes (Unsigned)
    Patient, No Pcp Per   No chief complaint on file.   HPI:      [name] is a 32 y.o. [phone] whose LMP was No LMP recorded (lmp unknown). 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   Healthy adult on routine physical examination 01/12/2020   Family history of colon cancer in father 01/12/2020    Past Surgical History:  Procedure Laterality Date   BREAST BIOPSY  2013   left   WISDOM TOOTH EXTRACTION  2009    Family History  Problem Relation Age of Onset   Hypertension Father    Colon cancer Father 86   Hypertension Paternal Grandfather    Lung cancer Paternal Grandfather     Social History   Socioeconomic History   Marital status: Single    Spouse name: Not on file   Number of children: Not on file   Years of education: Not on file   Highest education level: Not on file  Occupational History   Not on file  Tobacco Use   Smoking status: Never   Smokeless tobacco: Never  Vaping Use   Vaping Use: Never used  Substance and Sexual Activity   Alcohol use: No   Drug use: No   Sexual activity: Yes    Birth control/protection: Pill  Other Topics Concern   Not on file  Social History Narrative   Not on file   Social Determinants of Health   Financial Resource Strain: Not on file  Food Insecurity: Not on file  Transportation Needs: Not on file  Physical Activity: Not on file  Stress: Not on file  Social Connections: Not on file  Intimate Partner Violence: Not on file    Outpatient Medications Prior to Visit  Medication Sig Dispense Refill   metroNIDAZOLE (FLAGYL) 500 MG tablet Take 1 tab BID for 7 days; NO alcohol use for 10 days after prescription start 14 tablet 0   Norethindrone Acetate-Ethinyl Estrad-FE (MICROGESTIN 24 FE) 1-20 MG-MCG(24) tablet Take 1 tablet by mouth daily. 84 tablet 2   No  facility-administered medications prior to visit.      ROS:  Review of Systems BREAST: No symptoms   OBJECTIVE:   Vitals:  LMP  (LMP Unknown)   Physical Exam  Results: No results found for this or any previous visit (from the past 24 hour(s)).   Assessment/Plan: No diagnosis found.    No orders of the defined types were placed in this encounter.     No follow-ups on file.  Candace Higgins B. Candace Paradise, PA-C 09/29/2022 7:14 PM

## 2022-09-30 ENCOUNTER — Encounter: Payer: Self-pay | Admitting: Obstetrics and Gynecology

## 2022-09-30 ENCOUNTER — Ambulatory Visit: Payer: BC Managed Care – PPO | Admitting: Obstetrics and Gynecology

## 2022-09-30 VITALS — BP 124/70 | Ht 66.0 in | Wt 174.0 lb

## 2022-09-30 DIAGNOSIS — N3001 Acute cystitis with hematuria: Secondary | ICD-10-CM

## 2022-09-30 LAB — POCT URINALYSIS DIPSTICK
Bilirubin, UA: NEGATIVE
Glucose, UA: NEGATIVE
Ketones, UA: NEGATIVE
Nitrite, UA: POSITIVE
Protein, UA: NEGATIVE
Spec Grav, UA: 1.02 (ref 1.010–1.025)
pH, UA: 6 (ref 5.0–8.0)

## 2022-09-30 MED ORDER — NITROFURANTOIN MONOHYD MACRO 100 MG PO CAPS: 100.0000 mg | ORAL_CAPSULE | Freq: Two times a day (BID) | ORAL | 0 refills | Status: AC

## 2022-10-03 LAB — URINE CULTURE

## 2023-02-16 NOTE — Progress Notes (Unsigned)
PCP:  Patient, No Pcp Per   No chief complaint on file.    HPI:      [name] is a 32 y.o. [id number] whose LMP was No LMP recorded. (Menstrual status: Oral contraceptives)., presents today for her annual examination.  Her menses are absent on OCPs, takes placebo pills. Occas mild dysmen.   Sex activity: single partner, contraception - OCP (estrogen/progesterone). Occas pain with nabothian cyst; no bleeding. Last Pap: 01/17/21 Results were: no abnormalities /neg HPV DNA. No hx of abn paps.  There is no FH of breast cancer. There is no FH of ovarian cancer. Hx of benign breast mass in distant past with neg eval; doesn't remember side. No change in size. The patient does do self-breast exams. Has FH colon cancer in her dad at age 79; will need colonscopy starting age 85.   Tobacco use: The patient denies current or previous tobacco use. Alcohol use: social drinker No drug use.  Exercise: moderately active  She does get adequate calcium and Vitamin D in her diet.  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   Healthy adult on routine physical examination 01/12/2020   Family history of colon cancer in father 01/12/2020    Past Surgical History:  Procedure Laterality Date   BREAST BIOPSY  2013   left   WISDOM TOOTH EXTRACTION  2009    Family History  Problem Relation Age of Onset   Hypertension Father    Colon cancer Father 50   Hypertension Paternal Grandfather    Lung cancer Paternal Grandfather     Social History   Socioeconomic History   Marital status: Single    Spouse name: Not on file   Number of children: Not on file   Years of education: Not on file   Highest education level: Not on file  Occupational History   Not on file  Tobacco Use   Smoking status: Never   Smokeless tobacco: Never  Vaping Use   Vaping Use: Never used  Substance and Sexual Activity   Alcohol use: No   Drug use: No   Sexual activity:  Yes    Birth control/protection: Pill  Other Topics Concern   Not on file  Social History Narrative   Not on file   Social Determinants of Health   Financial Resource Strain: Not on file  Food Insecurity: Not on file  Transportation Needs: Not on file  Physical Activity: Not on file  Stress: Not on file  Social Connections: Not on file  Intimate Partner Violence: Not on file     Current Outpatient Medications:    Norethindrone Acetate-Ethinyl Estrad-FE (MICROGESTIN 24 FE) 1-20 MG-MCG(24) tablet, Take 1 tablet by mouth daily., Disp: 84 tablet, Rfl: 2     ROS:  Review of Systems  Constitutional:  Negative for fatigue, fever and unexpected weight change.  Respiratory:  Negative for cough, shortness of breath and wheezing.   Cardiovascular:  Negative for chest pain, palpitations and leg swelling.  Gastrointestinal:  Negative for blood in stool, constipation, diarrhea, nausea and vomiting.  Endocrine: Negative for cold intolerance, heat intolerance and polyuria.  Genitourinary:  Negative for dyspareunia, dysuria, flank pain, frequency, genital sores, hematuria, menstrual problem, pelvic pain, urgency, vaginal bleeding, vaginal discharge and vaginal pain.  Musculoskeletal:  Negative for back pain, joint swelling and myalgias.  Skin:  Negative for rash.  Neurological:  Negative for dizziness, syncope, light-headedness, numbness and headaches.  Hematological:  Negative for adenopathy.  Psychiatric/Behavioral:  Negative for agitation, confusion, sleep disturbance and suicidal ideas. The patient is not nervous/anxious.   BREAST: No symptoms   Objective: There were no vitals taken for this visit.   Physical Exam Constitutional:      Appearance: She is well-developed.  Genitourinary:     Vulva normal.     Right Labia: No rash, tenderness or lesions.    Left Labia: No tenderness, lesions or rash.    No vaginal discharge, erythema or tenderness.      Right Adnexa: not tender  and no mass present.    Left Adnexa: not tender and no mass present.    No cervical friability or polyp.     Uterus is not enlarged or tender.  Breasts:    Right: No mass, nipple discharge, skin change or tenderness.     Left: No mass, nipple discharge, skin change or tenderness.  Neck:     Thyroid: No thyromegaly.  Cardiovascular:     Rate and Rhythm: Normal rate and regular rhythm.     Heart sounds: Normal heart sounds. No murmur heard. Pulmonary:     Effort: Pulmonary effort is normal.     Breath sounds: Normal breath sounds.  Abdominal:     Palpations: Abdomen is soft.     Tenderness: There is no abdominal tenderness. There is no guarding or rebound.  Musculoskeletal:        General: Normal range of motion.     Cervical back: Normal range of motion.  Lymphadenopathy:     Cervical: No cervical adenopathy.  Neurological:     General: No focal deficit present.     Mental Status: She is alert and oriented to person, place, and time.     Cranial Nerves: No cranial nerve deficit.  Skin:    General: Skin is warm and dry.  Psychiatric:        Mood and Affect: Mood normal.        Behavior: Behavior normal.        Thought Content: Thought content normal.        Judgment: Judgment normal.  Vitals reviewed.    Assessment/Plan: Encounter for annual routine gynecological examination  Encounter for surveillance of contraceptive pills - Plan: LO LOESTRIN FE 1 MG-10 MCG / 10 MCG tablet; OCP RF  No orders of the defined types were placed in this encounter.            GYN counsel adequate intake of calcium and vitamin D, diet and exercise     F/U  No follow-ups on file.  Peyson Delao B. Shakiyah Cirilo, PA-C 02/16/2023 5:07 PM

## 2023-02-17 ENCOUNTER — Encounter: Payer: Self-pay | Admitting: Obstetrics and Gynecology

## 2023-02-17 ENCOUNTER — Ambulatory Visit (INDEPENDENT_AMBULATORY_CARE_PROVIDER_SITE_OTHER): Payer: BC Managed Care – PPO | Admitting: Obstetrics and Gynecology

## 2023-02-17 VITALS — BP 106/70 | Ht 66.0 in | Wt 179.0 lb

## 2023-02-17 DIAGNOSIS — Z800 Family history of malignant neoplasm of digestive organs: Secondary | ICD-10-CM

## 2023-02-17 DIAGNOSIS — Z01419 Encounter for gynecological examination (general) (routine) without abnormal findings: Secondary | ICD-10-CM | POA: Diagnosis not present

## 2023-02-17 DIAGNOSIS — Z3169 Encounter for other general counseling and advice on procreation: Secondary | ICD-10-CM

## 2023-02-17 DIAGNOSIS — Z3041 Encounter for surveillance of contraceptive pills: Secondary | ICD-10-CM

## 2023-02-17 MED ORDER — MICROGESTIN 24 FE 1-20 MG-MCG PO TABS: 1.0000 | ORAL_TABLET | Freq: Every day | ORAL | 3 refills | Status: DC

## 2023-02-17 NOTE — Patient Instructions (Signed)
I value your feedback and you entrusting us with your care. If you get a [facility] patient survey, I would appreciate you taking the time to let us know about your experience today. Thank you! ? ? ?

## 2023-06-26 ENCOUNTER — Ambulatory Visit (INDEPENDENT_AMBULATORY_CARE_PROVIDER_SITE_OTHER): Payer: BC Managed Care – PPO

## 2023-06-26 VITALS — BP 137/86 | HR 68 | Ht 66.0 in | Wt 179.5 lb

## 2023-06-26 DIAGNOSIS — Z3200 Encounter for pregnancy test, result unknown: Secondary | ICD-10-CM

## 2023-06-26 DIAGNOSIS — Z3201 Encounter for pregnancy test, result positive: Secondary | ICD-10-CM

## 2023-06-26 DIAGNOSIS — N912 Amenorrhea, unspecified: Secondary | ICD-10-CM

## 2023-06-26 LAB — POCT URINE PREGNANCY: Preg Test, Ur: POSITIVE — AB

## 2023-06-26 NOTE — Patient Instructions (Signed)
Commonly Asked Questions During Pregnancy  Cats: A parasite can be excreted in cat feces.  To avoid exposure you need to have another person empty the little box.  If you must empty the litter box you will need to wear gloves.  Wash your hands after handling your cat.  This parasite can also be found in raw or undercooked meat so this should also be avoided.  Colds, Sore Throats, Flu: Please check your medication sheet to see what you can take for symptoms.  If your symptoms are unrelieved by these medications please call the office.  Dental Work: Most any dental work Agricultural consultant recommends is permitted.  X-rays should only be taken during the first trimester if absolutely necessary.  Your abdomen should be shielded with a lead apron during all x-rays.  Please notify your provider prior to receiving any x-rays.  Novocaine is fine; gas is not recommended.  If your dentist requires a note from [place] prior to dental work please call the office and we will provide one for you.  Exercise: Exercise is an important part of staying healthy during your pregnancy.  You may continue most exercises you were accustomed to prior to pregnancy.  Later in your pregnancy you will most likely notice you have difficulty with activities requiring balance like riding a bicycle.  It is important that you listen to your body and avoid activities that put you at a higher risk of falling.  Adequate rest and staying well hydrated are a must!  If you have questions about the safety of specific activities ask your provider.    Exposure to Children with illness: Try to avoid obvious exposure; report any symptoms to [place] when noted,  If you have chicken pos, red measles or mumps, you should be immune to these diseases.   Please do not take any vaccines while pregnant unless you have checked with your OB provider.  Fetal Movement: After 28 weeks we recommend you do "kick counts" twice daily.  Lie or sit down in a calm quiet environment and  count your baby movements "kicks".  You should feel your baby at least 10 times per hour.  If you have not felt 10 kicks within the first hour get up, walk around and have something sweet to eat or drink then repeat for an additional hour.  If count remains less than 10 per hour notify your provider.  Fumigating: Follow your pest control agent's advice as to how long to stay out of your home.  Ventilate the area well before re-entering.  Hemorrhoids:   Most over-the-counter preparations can be used during pregnancy.  Check your medication to see what is safe to use.  It is important to use a stool softener or fiber in your diet and to drink lots of liquids.  If hemorrhoids seem to be getting worse please call the office.   Hot Tubs:  Hot tubs Jacuzzis and saunas are not recommended while pregnant.  These increase your internal body temperature and should be avoided.  Intercourse:  Sexual intercourse is safe during pregnancy as long as you are comfortable, unless otherwise advised by your provider.  Spotting may occur after intercourse; report any bright red bleeding that is heavier than spotting.  Labor:  If you know that you are in labor, please go to the hospital.  If you are unsure, please call the office and let us help you decide what to do.  Lifting, straining, etc:  If your job requires heavy  lifting or straining please check with your provider for any limitations.  Generally, you should not lift items heavier than that you can lift simply with your hands and arms (no back muscles)  Painting:  Paint fumes do not harm your pregnancy, but may make you ill and should be avoided if possible.  Latex or water based paints have less odor than oils.  Use adequate ventilation while painting.  Permanents & Hair Color:  Chemicals in hair dyes are not recommended as they cause increase hair dryness which can increase hair loss during pregnancy.  " Highlighting" and permanents are allowed.  Dye may be  absorbed differently and permanents may not hold as well during pregnancy.  Sunbathing:  Use a sunscreen, as skin burns easily during pregnancy.  Drink plenty of fluids; avoid over heating.  Tanning Beds:  Because their possible side effects are still unknown, tanning beds are not recommended.  Ultrasound Scans:  Routine ultrasounds are performed at approximately 20 weeks.  You will be able to see your baby's general anatomy an if you would like to know the gender this can usually be determined as well.  If it is questionable when you conceived you may also receive an ultrasound early in your pregnancy for dating purposes.  Otherwise ultrasound exams are not routinely performed unless there is a medical necessity.  Although you can request a scan we ask that you pay for it when conducted because insurance does not cover " patient request" scans.  Work: If your pregnancy proceeds without complications you may work until your due date, unless your physician or employer advises otherwise.  Round Ligament Pain/Pelvic Discomfort:  Sharp, shooting pains not associated with bleeding are fairly common, usually occurring in the second trimester of pregnancy.  They tend to be worse when standing up or when you remain standing for long periods of time.  These are the result of pressure of certain pelvic ligaments called "round ligaments".  Rest, Tylenol and heat seem to be the most effective relief.  As the womb and fetus grow, they rise out of the pelvis and the discomfort improves.  Please notify the office if your pain seems different than that described.  It may represent a more serious condition.  Common Medications Safe in Pregnancy  Acne:      Constipation:  Benzoyl Peroxide     Colace  Clindamycin      Dulcolax Suppository  Topica Erythromycin     Fibercon  Salicylic Acid      Metamucil         Miralax AVOID:        Senakot   Accutane    Cough:  Retin-A       Cough  Drops  Tetracycline      Phenergan w/ Codeine if Rx  Minocycline      Robitussin (Plain & DM)  Antibiotics:     Crabs/Lice:  Ceclor       RID  Cephalosporins    AVOID:  E-Mycins      Kwell  Keflex  Macrobid/Macrodantin   Diarrhea:  Penicillin      Kao-Pectate  Zithromax      Imodium AD         PUSH FLUIDS AVOID:       Cipro     Fever:  Tetracycline      Tylenol (Regular or Extra  Minocycline       Strength)  Levaquin      Extra Strength-Do not  Exceed 8 tabs/24 hrs Caffeine:        200mg /day (equiv. To 1 cup of coffee or  approx. 3 12 oz sodas)         Gas: Cold/Hayfever:       Gas-X  Benadryl      Mylicon  Claritin       Phazyme  **Claritin-D        Chlor-Trimeton    Headaches:  Dimetapp      ASA-Free Excedrin  Drixoral-Non-Drowsy     Cold Compress  Mucinex (Guaifenasin)     Tylenol (Regular or Extra  Sudafed/Sudafed-12 Hour     Strength)  **Sudafed PE Pseudoephedrine   Tylenol Cold & Sinus     Vicks Vapor Rub  Zyrtec  **AVOID if Problems With Blood Pressure         Heartburn: Avoid lying down for at least 1 hour after meals  Aciphex      Maalox     Rash:  Milk of Magnesia     Benadryl    Mylanta       1% Hydrocortisone Cream  Pepcid  Pepcid Complete   Sleep Aids:  Prevacid      Ambien   Prilosec       Benadryl  Rolaids       Chamomile Tea  Tums (Limit 4/day)     Unisom         Tylenol PM         Warm milk-add vanilla or  Hemorrhoids:       Sugar for taste  Anusol/Anusol H.C.  (RX: Analapram 2.5%)  Sugar Substitutes:  Hydrocortisone OTC     Ok in moderation  Preparation H      Tucks        Vaseline lotion applied to tissue with wiping    Herpes:     Throat:  Acyclovir      Oragel  Famvir  Valtrex     Vaccines:         Flu Shot Leg Cramps:       *Gardasil  Benadryl      Hepatitis A         Hepatitis B Nasal Spray:       Pneumovax  Saline Nasal Spray     Polio Booster         Tetanus Nausea:       Tuberculosis test or PPD  Vitamin  B6 25 mg TID   AVOID:    Dramamine      *Gardasil  Emetrol       Live Poliovirus  Ginger Root 250 mg QID    MMR (measles, mumps &  High Complex Carbs @ Bedtime    rebella)  Sea Bands-Accupressure    Varicella (Chickenpox)  Unisom 1/2 tab TID     *No known complications           If received before Pain:         Known pregnancy;   Darvocet       Resume series after  Lortab        Delivery  Percocet    Yeast:   Tramadol      Femstat  Tylenol 3      Gyne-lotrimin  Ultram       Monistat  Vicodin           MISC:         All Sunscreens  Hair Coloring/highlights          Insect Repellant's          (Including DEET)         Mystic Tans First Trimester of Pregnancy  The first trimester of pregnancy starts on the first day of your last menstrual period until the end of week 12. This is also called months 1 through 3 of pregnancy. Body changes during your first trimester Your body goes through many changes during pregnancy. The changes usually return to normal after your baby is born. Physical changes You may gain or lose weight. Your breasts may grow larger and hurt. The area around your nipples may get darker. Dark spots or blotches may develop on your face. You may have changes in your hair. Health changes You may feel like you might vomit (nauseous), and you may vomit. You may have heartburn. You may have headaches. You may have trouble pooping (constipation). Your gums may bleed. Other changes You may get tired easily. You may pee (urinate) more often. Your menstrual periods will stop. You may not feel hungry. You may want to eat certain kinds of food. You may have changes in your emotions from day to day. You may have more dreams. Follow these instructions at home: Medicines Take over-the-counter and prescription medicines only as told by your doctor. Some medicines are not safe during pregnancy. Take a prenatal vitamin that contains at least 600 micrograms (mcg)  of folic acid. Eating and drinking Eat healthy meals that include: Fresh fruits and vegetables. Whole grains. Good sources of protein, such as meat, eggs, or tofu. Low-fat dairy products. Avoid raw meat and unpasteurized juice, milk, and cheese. If you feel like you may vomit, or you vomit: Eat 4 or 5 small meals a day instead of 3 large meals. Try eating a few soda crackers. Drink liquids between meals instead of during meals. You may need to take these actions to prevent or treat trouble pooping: Drink enough fluids to keep your pee (urine) pale yellow. Eat foods that are high in fiber. These include beans, whole grains, and fresh fruits and vegetables. Limit foods that are high in fat and sugar. These include fried or sweet foods. Activity Exercise only as told by your doctor. Most people can do their usual exercise routine during pregnancy. Stop exercising if you have cramps or pain in your lower belly (abdomen) or low back. Do not exercise if it is too hot or too humid, or if you are in a place of great height (high altitude). Avoid heavy lifting. If you choose to, you may have sex unless your doctor tells you not to. Relieving pain and discomfort Wear a good support bra if your breasts are sore. Rest with your legs raised (elevated) if you have leg cramps or low back pain. If you have bulging veins (varicose veins) in your legs: Wear support hose as told by your doctor. Raise your feet for 15 minutes, 3-4 times a day. Limit salt in your food. Safety Wear your seat belt at all times when you are in a car. Talk with your doctor if someone is hurting you or yelling at you. Talk with your doctor if you are feeling sad or have thoughts of hurting yourself. Lifestyle Do not use hot tubs, steam rooms, or saunas. Do not douche. Do not use tampons or scented sanitary pads. Do not use herbal medicines, illegal drugs, or medicines that are not approved by your doctor. Do not drink  alcohol. Do not smoke or use any products that contain nicotine or tobacco. If you need help quitting, ask your doctor. Avoid cat litter boxes and soil that is used by cats. These carry germs that can cause harm to the baby and can cause a loss of your baby by miscarriage or stillbirth. General instructions Keep all follow-up visits. This is important. Ask for help if you need counseling or if you need help with nutrition. Your doctor can give you advice or tell you where to go for help. Visit your dentist. At home, brush your teeth with a soft toothbrush. Floss gently. Write down your questions. Take them to your prenatal visits. Where to find more information American Pregnancy Association: americanpregnancy.org [employer]: www.acog.org Office on Women's Health: [employer] Contact a doctor if: You are dizzy. You have a fever. You have mild cramps or pressure in your lower belly. You have a nagging pain in your belly area. You continue to feel like you may vomit, you vomit, or you have watery poop (diarrhea) for 24 hours or longer. You have a bad-smelling fluid coming from your vagina. You have pain when you pee. You are exposed to a disease that spreads from person to person, such as chickenpox, measles, Zika virus, HIV, or hepatitis. Get help right away if: You have spotting or bleeding from your vagina. You have very bad belly cramping or pain. You have shortness of breath or chest pain. You have any kind of injury, such as from a fall or a car crash. You have new or increased pain, swelling, or redness in an arm or leg. Summary The first trimester of pregnancy starts on the first day of your last menstrual period until the end of week 12 (months 1 through 3). Eat 4 or 5 small meals a day instead of 3 large meals. Do not smoke or use any products that contain nicotine or tobacco. If you need help quitting, ask your  doctor. Keep all follow-up visits. This information is not intended to replace advice given to you by your health care provider. Make sure you discuss any questions you have with your health care provider. Document Revised: 01/25/2020 Document Reviewed: 12/01/2019 Elsevier Patient Education  2024 [employer].

## 2023-06-26 NOTE — Progress Notes (Signed)
    NURSE VISIT NOTE  Subjective:    Patient ID: Candace Higgins, female    DOB: 09-Oct-1990, 32 y.o.   MRN: [id number]  HPI  Patient is a 32 y.o. [id number] female who presents for evaluation of amenorrhea. She believes she could be pregnant. Pregnancy is desired. Sexual Activity: single partner, contraception: none. Current symptoms also include: breast tenderness. Last period was abnormal, recently off OCP's.    Objective:    BP 137/86   Pulse 68   Ht 5\' 6"  (1.676 m)   Wt 179 lb 8 oz (81.4 kg)   LMP 05/24/2023   BMI 28.97 kg/m   Lab Review  Results for orders placed or performed in visit on 06/26/23  POCT urine pregnancy  Result Value Ref Range   Preg Test, Ur Positive (A) Negative    Assessment:   1. Possible pregnancy, not yet confirmed     Plan:   Pregnancy Test: Positive  Estimated Date of Delivery: 02/28/24 BP Cuff Measurement taken. Cuff Size Adult Large Encouraged well-balanced diet, plenty of rest when needed, pre-natal vitamins daily and walking for exercise.  Discussed self-help for nausea, avoiding OTC medications until consulting provider or pharmacist, other than Tylenol as needed, minimal caffeine (1-2 cups daily) and avoiding alcohol.   She will schedule her nurse visit @ 7-[redacted] wks pregnant, u/s for dating @10  wk, and NOB visit at [redacted] wk pregnant.    Feel free to call with any questions.    Loman Chroman, CMA

## 2023-07-14 ENCOUNTER — Telehealth: Payer: BC Managed Care – PPO

## 2023-07-17 ENCOUNTER — Telehealth (INDEPENDENT_AMBULATORY_CARE_PROVIDER_SITE_OTHER): Payer: BC Managed Care – PPO

## 2023-07-17 VITALS — Wt 179.0 lb

## 2023-07-17 DIAGNOSIS — Z3480 Encounter for supervision of other normal pregnancy, unspecified trimester: Secondary | ICD-10-CM | POA: Insufficient documentation

## 2023-07-17 DIAGNOSIS — Z369 Encounter for antenatal screening, unspecified: Secondary | ICD-10-CM

## 2023-07-17 DIAGNOSIS — Z3689 Encounter for other specified antenatal screening: Secondary | ICD-10-CM | POA: Diagnosis not present

## 2023-07-17 NOTE — Patient Instructions (Addendum)
Second Trimester of Pregnancy  The second trimester of pregnancy is from week 13 through week 27. This is months 4 through 6 of pregnancy. The second trimester is often a time when you feel your best. Your body has adjusted to being pregnant, and you begin to feel better physically. During the second trimester: Morning sickness has lessened or stopped completely. You may have more energy. You may have an increase in appetite. The second trimester is also a time when the unborn baby (fetus) is growing rapidly. At the end of the sixth month, the fetus may be up to 12 inches long and weigh about 1 pounds. You will likely begin to feel the baby move (quickening) between 16 and 20 weeks of pregnancy. Body changes during your second trimester Your body continues to go through many changes during your second trimester. The changes vary and generally return to normal after the baby is born. Physical changes Your weight will continue to increase. You will notice your lower abdomen bulging out. You may begin to get stretch marks on your hips, abdomen, and breasts. Your breasts will continue to grow and to become tender. Dark spots or blotches (chloasma or mask of pregnancy) may develop on your face. A dark line from your belly button to the pubic area (linea nigra) may appear. You may have changes in your hair. These can include thickening of your hair, rapid growth, and changes in texture. Some people also have hair loss during or after pregnancy, or hair that feels dry or thin. Health changes You may develop headaches. You may have heartburn. You may develop constipation. You may develop hemorrhoids or swollen, bulging veins (varicose veins). Your gums may bleed and may be sensitive to brushing and flossing. You may urinate more often because the fetus is pressing on your bladder. You may have back pain. This is caused by: Weight gain. Pregnancy hormones that are relaxing the joints in your  pelvis. A shift in weight and the muscles that support your balance. Follow these instructions at home: Medicines Follow your health care provider's instructions regarding medicine use. Specific medicines may be either safe or unsafe to take during pregnancy. Do not take any medicines unless approved by your health care provider. Take a prenatal vitamin that contains at least 600 micrograms (mcg) of folic acid. Eating and drinking Eat a healthy diet that includes fresh fruits and vegetables, whole grains, good sources of protein such as meat, eggs, or tofu, and low-fat dairy products. Avoid raw meat and unpasteurized juice, milk, and cheese. These carry germs that can harm you and your baby. You may need to take these actions to prevent or treat constipation: Drink enough fluid to keep your urine pale yellow. Eat foods that are high in fiber, such as beans, whole grains, and fresh fruits and vegetables. Limit foods that are high in fat and processed sugars, such as fried or sweet foods. Activity Exercise only as directed by your health care provider. Most people can continue their usual exercise routine during pregnancy. Try to exercise for 30 minutes at least 5 days a week. Stop exercising if you develop contractions in your uterus. Stop exercising if you develop pain or cramping in the lower abdomen or lower back. Avoid exercising if it is very hot or humid or if you are at a high altitude. Avoid heavy lifting. If you choose to, you may have sex unless your health care provider tells you not to. Relieving pain and discomfort Wear a supportive   bra to prevent discomfort from breast tenderness. Take warm sitz baths to soothe any pain or discomfort caused by hemorrhoids. Use hemorrhoid cream if your health care provider approves. Rest with your legs raised (elevated) if you have leg cramps or low back pain. If you develop varicose veins: Wear support hose as told by your health care  provider. Elevate your feet for 15 minutes, 3-4 times a day. Limit salt in your diet. Safety Wear your seat belt at all times when driving or riding in a car. Talk with your health care provider if someone is verbally or physically abusive to you. Lifestyle Do not use hot tubs, steam rooms, or saunas. Do not douche. Do not use tampons or scented sanitary pads. Avoid cat litter boxes and soil used by cats. These carry germs that can cause birth defects in the baby and possibly loss of the fetus by miscarriage or stillbirth. Do not use herbal remedies, alcohol, illegal drugs, or medicines that are not approved by your health care provider. Chemicals in these products can harm your baby. Do not use any products that contain nicotine or tobacco, such as cigarettes, e-cigarettes, and chewing tobacco. If you need help quitting, ask your health care provider. General instructions During a routine prenatal visit, your health care provider will do a physical exam and other tests. He or she will also discuss your overall health. Keep all follow-up visits. This is important. Ask your health care provider for a referral to a local prenatal education class. Ask for help if you have counseling or nutritional needs during pregnancy. Your health care provider can offer advice or refer you to specialists for help with various needs. Where to find more information American Pregnancy Association: americanpregnancy.org American College of Obstetricians and Gynecologists: acog.org/en/Womens%20Health/Pregnancy Office on Women's Health: womenshealth.gov/pregnancy Contact a health care provider if you have: A headache that does not go away when you take medicine. Vision changes or you see spots in front of your eyes. Mild pelvic cramps, pelvic pressure, or nagging pain in the abdominal area. Persistent nausea, vomiting, or diarrhea. A bad-smelling vaginal discharge or foul-smelling urine. Pain when you  urinate. Sudden or extreme swelling of your face, hands, ankles, feet, or legs. A fever. Get help right away if you: Have fluid leaking from your vagina. Have spotting or bleeding from your vagina. Have severe abdominal cramping or pain. Have difficulty breathing. Have chest pain. Have fainting spells. Have not felt your baby move for the time period told by your health care provider. Have new or increased pain, swelling, or redness in an arm or leg. Summary The second trimester of pregnancy is from week 13 through week 27 (months 4 through 6). Do not use herbal remedies, alcohol, illegal drugs, or medicines that are not approved by your health care provider. Chemicals in these products can harm your baby. Exercise only as directed by your health care provider. Most people can continue their usual exercise routine during pregnancy. Keep all follow-up visits. This is important. This information is not intended to replace advice given to you by your health care provider. Make sure you discuss any questions you have with your health care provider. Document Revised: 01/25/2020 Document Reviewed: 12/01/2019 Elsevier Patient Education  2024 [employer] First Trimester of Pregnancy  The first trimester of pregnancy starts on the first day of your last menstrual period until the end of week 12. This is also called months 1 through 3 of pregnancy. Body changes during your first trimester Your   body goes through many changes during pregnancy. The changes usually return to normal after your baby is born. Physical changes You may gain or lose weight. Your breasts may grow larger and hurt. The area around your nipples may get darker. Dark spots or blotches may develop on your face. You may have changes in your hair. Health changes You may feel like you might vomit (nauseous), and you may vomit. You may have heartburn. You may have headaches. You may have trouble pooping (constipation). Your  gums may bleed. Other changes You may get tired easily. You may pee (urinate) more often. Your menstrual periods will stop. You may not feel hungry. You may want to eat certain kinds of food. You may have changes in your emotions from day to day. You may have more dreams. Follow these instructions at home: Medicines Take over-the-counter and prescription medicines only as told by your doctor. Some medicines are not safe during pregnancy. Take a prenatal vitamin that contains at least 600 micrograms (mcg) of folic acid. Eating and drinking Eat healthy meals that include: Fresh fruits and vegetables. Whole grains. Good sources of protein, such as meat, eggs, or tofu. Low-fat dairy products. Avoid raw meat and unpasteurized juice, milk, and cheese. If you feel like you may vomit, or you vomit: Eat 4 or 5 small meals a day instead of 3 large meals. Try eating a few soda crackers. Drink liquids between meals instead of during meals. You may need to take these actions to prevent or treat trouble pooping: Drink enough fluids to keep your pee (urine) pale yellow. Eat foods that are high in fiber. These include beans, whole grains, and fresh fruits and vegetables. Limit foods that are high in fat and sugar. These include fried or sweet foods. Activity Exercise only as told by your doctor. Most people can do their usual exercise routine during pregnancy. Stop exercising if you have cramps or pain in your lower belly (abdomen) or low back. Do not exercise if it is too hot or too humid, or if you are in a place of great height (high altitude). Avoid heavy lifting. If you choose to, you may have sex unless your doctor tells you not to. Relieving pain and discomfort Wear a good support bra if your breasts are sore. Rest with your legs raised (elevated) if you have leg cramps or low back pain. If you have bulging veins (varicose veins) in your legs: Wear support hose as told by your  doctor. Raise your feet for 15 minutes, 3-4 times a day. Limit salt in your food. Safety Wear your seat belt at all times when you are in a car. Talk with your doctor if someone is hurting you or yelling at you. Talk with your doctor if you are feeling sad or have thoughts of hurting yourself. Lifestyle Do not use hot tubs, steam rooms, or saunas. Do not douche. Do not use tampons or scented sanitary pads. Do not use herbal medicines, illegal drugs, or medicines that are not approved by your doctor. Do not drink alcohol. Do not smoke or use any products that contain nicotine or tobacco. If you need help quitting, ask your doctor. Avoid cat litter boxes and soil that is used by cats. These carry germs that can cause harm to the baby and can cause a loss of your baby by miscarriage or stillbirth. General instructions Keep all follow-up visits. This is important. Ask for help if you need counseling or if you need help with   nutrition. Your doctor can give you advice or tell you where to go for help. Visit your dentist. At home, brush your teeth with a soft toothbrush. Floss gently. Write down your questions. Take them to your prenatal visits. Where to find more information American Pregnancy Association: americanpregnancy.org American College of Obstetricians and Gynecologists: www.acog.org Office on Women's Health: womenshealth.gov/pregnancy Contact a doctor if: You are dizzy. You have a fever. You have mild cramps or pressure in your lower belly. You have a nagging pain in your belly area. You continue to feel like you may vomit, you vomit, or you have watery poop (diarrhea) for 24 hours or longer. You have a bad-smelling fluid coming from your vagina. You have pain when you pee. You are exposed to a disease that spreads from person to person, such as chickenpox, measles, Zika virus, HIV, or hepatitis. Get help right away if: You have spotting or bleeding from your vagina. You have  very bad belly cramping or pain. You have shortness of breath or chest pain. You have any kind of injury, such as from a fall or a car crash. You have new or increased pain, swelling, or redness in an arm or leg. Summary The first trimester of pregnancy starts on the first day of your last menstrual period until the end of week 12 (months 1 through 3). Eat 4 or 5 small meals a day instead of 3 large meals. Do not smoke or use any products that contain nicotine or tobacco. If you need help quitting, ask your doctor. Keep all follow-up visits. This information is not intended to replace advice given to you by your health care provider. Make sure you discuss any questions you have with your health care provider. Document Revised: 01/25/2020 Document Reviewed: 12/01/2019 Elsevier Patient Education  2024 [employer] Inc. Commonly Asked Questions During Pregnancy  Cats: A parasite can be excreted in cat feces.  To avoid exposure you need to have another person empty the little box.  If you must empty the litter box you will need to wear gloves.  Wash your hands after handling your cat.  This parasite can also be found in raw or undercooked meat so this should also be avoided.  Colds, Sore Throats, Flu: Please check your medication sheet to see what you can take for symptoms.  If your symptoms are unrelieved by these medications please call the office.  Dental Work: Most any dental work your dentist recommends is permitted.  X-rays should only be taken during the first trimester if absolutely necessary.  Your abdomen should be shielded with a lead apron during all x-rays.  Please notify your provider prior to receiving any x-rays.  Novocaine is fine; gas is not recommended.  If your dentist requires a note from us prior to dental work please call the office and we will provide one for you.  Exercise: Exercise is an important part of staying healthy during your pregnancy.  You may continue most exercises  you were accustomed to prior to pregnancy.  Later in your pregnancy you will most likely notice you have difficulty with activities requiring balance like riding a bicycle.  It is important that you listen to your body and avoid activities that put you at a higher risk of falling.  Adequate rest and staying well hydrated are a must!  If you have questions about the safety of specific activities ask your provider.    Exposure to Children with illness: Try to avoid obvious exposure; report any   symptoms to us when noted,  If you have chicken pos, red measles or mumps, you should be immune to these diseases.   Please do not take any vaccines while pregnant unless you have checked with your OB provider.  Fetal Movement: After 28 weeks we recommend you do "kick counts" twice daily.  Lie or sit down in a calm quiet environment and count your baby movements "kicks".  You should feel your baby at least 10 times per hour.  If you have not felt 10 kicks within the first hour get up, walk around and have something sweet to eat or drink then repeat for an additional hour.  If count remains less than 10 per hour notify your provider.  Fumigating: Follow your pest control agent's advice as to how long to stay out of your home.  Ventilate the area well before re-entering.  Hemorrhoids:   Most over-the-counter preparations can be used during pregnancy.  Check your medication to see what is safe to use.  It is important to use a stool softener or fiber in your diet and to drink lots of liquids.  If hemorrhoids seem to be getting worse please call the office.   Hot Tubs:  Hot tubs Jacuzzis and saunas are not recommended while pregnant.  These increase your internal body temperature and should be avoided.  Intercourse:  Sexual intercourse is safe during pregnancy as long as you are comfortable, unless otherwise advised by your provider.  Spotting may occur after intercourse; report any bright red bleeding that is heavier  than spotting.  Labor:  If you know that you are in labor, please go to the hospital.  If you are unsure, please call the office and let us help you decide what to do.  Lifting, straining, etc:  If your job requires heavy lifting or straining please check with your provider for any limitations.  Generally, you should not lift items heavier than that you can lift simply with your hands and arms (no back muscles)  Painting:  Paint fumes do not harm your pregnancy, but may make you ill and should be avoided if possible.  Latex or water based paints have less odor than oils.  Use adequate ventilation while painting.  Permanents & Hair Color:  Chemicals in hair dyes are not recommended as they cause increase hair dryness which can increase hair loss during pregnancy.  " Highlighting" and permanents are allowed.  Dye may be absorbed differently and permanents may not hold as well during pregnancy.  Sunbathing:  Use a sunscreen, as skin burns easily during pregnancy.  Drink plenty of fluids; avoid over heating.  Tanning Beds:  Because their possible side effects are still unknown, tanning beds are not recommended.  Ultrasound Scans:  Routine ultrasounds are performed at approximately 20 weeks.  You will be able to see your baby's general anatomy an if you would like to know the gender this can usually be determined as well.  If it is questionable when you conceived you may also receive an ultrasound early in your pregnancy for dating purposes.  Otherwise ultrasound exams are not routinely performed unless there is a medical necessity.  Although you can request a scan we ask that you pay for it when conducted because insurance does not cover " patient request" scans.  Work: If your pregnancy proceeds without complications you may work until your due date, unless your physician or employer advises otherwise.  Round Ligament Pain/Pelvic Discomfort:  Sharp, shooting pains not associated   with bleeding are  fairly common, usually occurring in the second trimester of pregnancy.  They tend to be worse when standing up or when you remain standing for long periods of time.  These are the result of pressure of certain pelvic ligaments called "round ligaments".  Rest, Tylenol and heat seem to be the most effective relief.  As the womb and fetus grow, they rise out of the pelvis and the discomfort improves.  Please notify the office if your pain seems different than that described.  It may represent a more serious condition.  Common Medications Safe in Pregnancy  Acne:      Constipation:  Benzoyl Peroxide     Colace  Clindamycin      Dulcolax Suppository  Topica Erythromycin     Fibercon  Salicylic Acid      Metamucil         Miralax AVOID:        Senakot   Accutane    Cough:  Retin-A       Cough Drops  Tetracycline      Phenergan w/ Codeine if Rx  Minocycline      Robitussin (Plain & DM)  Antibiotics:     Crabs/Lice:  Ceclor       RID  Cephalosporins    AVOID:  E-Mycins      Kwell  Keflex  Macrobid/Macrodantin   Diarrhea:  Penicillin      Kao-Pectate  Zithromax      Imodium AD         PUSH FLUIDS AVOID:       Cipro     Fever:  Tetracycline      Tylenol (Regular or Extra  Minocycline       Strength)  Levaquin      Extra Strength-Do not          Exceed 8 tabs/24 hrs Caffeine:        <200mg/day (equiv. To 1 cup of coffee or  approx. 3 12 oz sodas)         Gas: Cold/Hayfever:       Gas-X  Benadryl      Mylicon  Claritin       Phazyme  **Claritin-D        Chlor-Trimeton    Headaches:  Dimetapp      ASA-Free Excedrin  Drixoral-Non-Drowsy     Cold Compress  Mucinex (Guaifenasin)     Tylenol (Regular or Extra  Sudafed/Sudafed-12 Hour     Strength)  **Sudafed PE Pseudoephedrine   Tylenol Cold & Sinus     Vicks Vapor Rub  Zyrtec  **AVOID if Problems With Blood Pressure         Heartburn: Avoid lying down for at least 1 hour after meals  Aciphex      Maalox     Rash:  Milk of  Magnesia     Benadryl    Mylanta       1% Hydrocortisone Cream  Pepcid  Pepcid Complete   Sleep Aids:  Prevacid      Ambien   Prilosec       Benadryl  Rolaids       Chamomile Tea  Tums (Limit 4/day)     Unisom         Tylenol PM         Warm milk-add vanilla or  Hemorrhoids:       Sugar for taste  Anusol/Anusol H.C.  (RX: Analapram 2.5%)  Sugar Substitutes:    Hydrocortisone OTC     Ok in moderation  Preparation H      Tucks        Vaseline lotion applied to tissue with wiping    Herpes:     Throat:  Acyclovir      Oragel  Famvir  Valtrex     Vaccines:         Flu Shot Leg Cramps:       *Gardasil  Benadryl      Hepatitis A         Hepatitis B Nasal Spray:       Pneumovax  Saline Nasal Spray     Polio Booster         Tetanus Nausea:       Tuberculosis test or PPD  Vitamin B6 25 mg TID   AVOID:    Dramamine      *Gardasil  Emetrol       Live Poliovirus  Ginger Root 250 mg QID    MMR (measles, mumps &  High Complex Carbs @ Bedtime    rebella)  Sea Bands-Accupressure    Varicella (Chickenpox)  Unisom 1/2 tab TID     *No known complications           If received before Pain:         Known pregnancy;   Darvocet       Resume series after  Lortab        Delivery  Percocet    Yeast:   Tramadol      Femstat  Tylenol 3      Gyne-lotrimin  Ultram       Monistat  Vicodin           MISC:         All Sunscreens           Hair Coloring/highlights          Insect Repellant's          (Including DEET)         Mystic Tans  

## 2023-07-17 NOTE — Progress Notes (Signed)
New OB Intake  I connected with  Candace Higgins on 07/17/23 at 10:15 AM EST by Video Visit and verified that I am speaking with the correct person using two identifiers. Nurse is located at [facility] and pt is located at car.  I explained I am completing New OB Intake today. We discussed her EDD of 02/28/2024 that is based on LMP of 05/24/2023. Pt is G3/P2002. I reviewed her allergies, medications, Medical/Surgical/OB history, and appropriate screenings. There are barn cats at the home. Based on history, this is a/an pregnancy uncomplicated .   Patient Active Problem List   Diagnosis Date Noted   Supervision of other normal pregnancy, antepartum 07/17/2023    Concerns addressed today Of mention, Pt had mild preeclampsia in G1.  Delivery Plans:  Plans to deliver at [facility].   Anatomy US Explained first scheduled [place] is scheduled for 08/03/23 and an anatomy scan will be done at 20 weeks.  Labs Discussed genetic screening with patient. Patient desires genetic testing to be drawn at new OB visit. Discussed possible labs to be drawn at new OB appointment.  COVID Vaccine Patient has not had COVID vaccine.   Social Determinants of Health Food Insecurity: denies food insecurity Transportation: Patient denies transportation needs. Childcare: Discussed no children allowed at ultrasound appointments.   First visit review I reviewed new OB appt with pt. I explained she will have ob bloodwork and pap smear/pelvic exam if indicated. Explained pt will be seen by an AOB Provider at first visit; encounter routed to appropriate provider.   Loran Senters, [facility] 07/17/2023  10:47 AM

## 2023-08-03 ENCOUNTER — Other Ambulatory Visit: Payer: Self-pay | Admitting: Certified Nurse Midwife

## 2023-08-03 ENCOUNTER — Ambulatory Visit: Payer: BC Managed Care – PPO

## 2023-08-03 DIAGNOSIS — O3680X0 Pregnancy with inconclusive fetal viability, not applicable or unspecified: Secondary | ICD-10-CM

## 2023-08-03 DIAGNOSIS — Z3480 Encounter for supervision of other normal pregnancy, unspecified trimester: Secondary | ICD-10-CM

## 2023-08-03 DIAGNOSIS — Z3A10 10 weeks gestation of pregnancy: Secondary | ICD-10-CM | POA: Diagnosis not present

## 2023-08-11 NOTE — Progress Notes (Unsigned)
OBSTETRIC INITIAL PRENATAL VISIT  Subjective:    Candace Higgins is being seen today for her first obstetrical visit.  This {is/is not:9024} a planned pregnancy. She is a 32 y.o. G76P2002 female at [redacted]w[redacted]d gestation, Estimated Date of Delivery: 02/28/24 with Patient's last menstrual period was 05/24/2023 (exact date).,  consistent with 10 week sono. Her obstetrical history is significant for {ob risk factors:10154}. Relationship with FOB: {fob:16621}. Patient {does/does not:19097} intend to breast feed. Pregnancy history fully reviewed.    OB History  Gravida Para Term Preterm AB Living  3 2 2  0 0 2  SAB IAB Ectopic Multiple Live Births  0 0 0 0 2    # Outcome Date GA Lbr Len/2nd Weight Sex Type Anes PTL Lv  3 Current           2 Term 01/28/18 [redacted]w[redacted]d / 00:49 8 lb 15.2 oz (4.06 kg) F Vag-Spont EPI  LIV     Name: Kavan,GIRL Raja     Apgar1: 8  Apgar5: 9  1 Term 09/01/14 [redacted]w[redacted]d  6 lb 7 oz (2.92 kg) M Vag-Spont   LIV     Complications: Mild preeclampsia    Obstetric Comments  First menstrual period age 33    Gynecologic History:  Last pap smear was 01/17/2021.  Results were Normal.  {Actions; denies-reports:120008} h/o abnormal pap smears in the past.  {Actions; denies-reports:120008} history of STIs.  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    Family History  Problem Relation Age of Onset   Healthy Mother    Hypertension Father    Colon cancer Father 32   Healthy Sister    Healthy Sister    Healthy Brother    Healthy Maternal Grandmother    Healthy Maternal Grandfather    Healthy Paternal Grandmother    Hypertension Paternal Grandfather    Lung cancer Paternal Grandfather     Past Surgical History:  Procedure Laterality Date   BREAST BIOPSY  09/02/2011   left   nail biopsy Left 03/14/2022   2nd toe   WISDOM TOOTH  EXTRACTION  09/02/2007    Social History   Socioeconomic History   Marital status: Married    Spouse name: Daphine Deutscher   Number of children: 2   Years of education: 12   Highest education level: Not on file  Occupational History   Occupation: [employer] as a Holiday representative  Tobacco Use   Smoking status: Never   Smokeless tobacco: Never  Vaping Use   Vaping status: Never Used  Substance and Sexual Activity   Alcohol use: Not Currently    Comment: occ   Drug use: No   Sexual activity: Yes    Partners: Male    Birth control/protection: None  Other Topics Concern   Not on file  Social History Narrative   Not on file   Social Determinants of Health   Financial Resource Strain: Low Risk  (07/17/2023)   Overall Financial Resource Strain (CARDIA)    Difficulty of Paying Living Expenses: Not very hard  Food Insecurity: No Food Insecurity (07/17/2023)   Hunger Vital Sign    Worried About Running Out of Food in the Last Year: Never true    Ran Out of Food in the Last Year: Never true  Transportation Needs: No Transportation Needs (07/17/2023)  PRAPARE - Administrator, Civil Service (Medical): No    Lack of Transportation (Non-Medical): No  Physical Activity: Sufficiently Active (07/17/2023)   Exercise Vital Sign    Days of Exercise per Week: 7 days    Minutes of Exercise per Session: 30 min  Stress: No Stress Concern Present (07/17/2023)   [employer] of Occupational Health - Occupational Stress Questionnaire    Feeling of Stress : Not at all  Social Connections: Moderately Integrated (07/17/2023)   Social Connection and Isolation Panel [NHANES]    Frequency of Communication with Friends and Family: More than three times a week    Frequency of Social Gatherings with Friends and Family: More than three times a week    Attends Religious Services: More than 4 times per year    Active Member of [employer] or Organizations: No    Attends Banker  Meetings: Never    Marital Status: Married  Catering manager Violence: Not At Risk (07/17/2023)   Humiliation, Afraid, Rape, and Kick questionnaire    Fear of Current or Ex-Partner: No    Emotionally Abused: No    Physically Abused: No    Sexually Abused: No    Current Outpatient Medications on File Prior to Visit  Medication Sig Dispense Refill   Prenatal Vit-Fe Fumarate-FA (MULTIVITAMIN-PRENATAL) 27-0.8 MG TABS tablet Take 1 tablet by mouth daily at 12 noon.     No current facility-administered medications on file prior to visit.    No Known Allergies   Review of Systems General: Not Present- Fever, Weight Loss and Weight Gain. Skin: Not Present- Rash. HEENT: Not Present- Blurred Vision, Headache and Bleeding Gums. Respiratory: Not Present- Difficulty Breathing. Breast: Not Present- Breast Mass. Cardiovascular: Not Present- Chest Pain, Elevated Blood Pressure, Fainting / Blacking Out and Shortness of Breath. Gastrointestinal: Not Present- Abdominal Pain, Constipation, Nausea and Vomiting. Female Genitourinary: Not Present- Frequency, Painful Urination, Pelvic Pain, Vaginal Bleeding, Vaginal Discharge, Contractions, regular, Fetal Movements Decreased, Urinary Complaints and Vaginal Fluid. Musculoskeletal: Not Present- Back Pain and Leg Cramps. Neurological: Not Present- Dizziness. Psychiatric: Not Present- Depression.     Objective:   Last menstrual period 05/24/2023.  There is no height or weight on file to calculate BMI.  General Appearance:    Alert, cooperative, no distress, appears stated age  Head:    Normocephalic, without obvious abnormality, atraumatic  Eyes:    PERRL, conjunctiva/corneas clear, EOM's intact, both eyes  Ears:    Normal external ear canals, both ears  Nose:   Nares normal, septum midline, mucosa normal, no drainage or sinus tenderness  Throat:   Lips, mucosa, and tongue normal; teeth and gums normal  Neck:   Supple, symmetrical, trachea midline,  no adenopathy; thyroid: no enlargement/tenderness/nodules; no carotid bruit or JVD  Back:     Symmetric, no curvature, ROM normal, no CVA tenderness  Lungs:     Clear to auscultation bilaterally, respirations unlabored  Chest Wall:    No tenderness or deformity   Heart:    Regular rate and rhythm, S1 and S2 normal, no murmur, rub or gallop  Breast Exam:    No tenderness, masses, or nipple abnormality  Abdomen:     Soft, non-tender, bowel sounds active all four quadrants, no masses, no organomegaly.  FHT ***  bpm.  Genitalia:    Pelvic:external genitalia normal, vagina without lesions, discharge, or tenderness, rectovaginal septum  normal. Cervix normal in appearance, no cervical motion tenderness, no adnexal masses or tenderness.  Pregnancy  positive findings: uterine enlargement: *** wk size, nontender.   Rectal:    Normal external sphincter.  No hemorrhoids appreciated. Internal exam not done.   Extremities:   Extremities normal, atraumatic, no cyanosis or edema  Pulses:   2+ and symmetric all extremities  Skin:   Skin color, texture, turgor normal, no rashes or lesions  Lymph nodes:   Cervical, supraclavicular, and axillary nodes normal  Neurologic:   CNII-XII intact, normal strength, sensation and reflexes throughout     Assessment:   No diagnosis found.  Plan:   Supervision of ***normal/high risk pregnancy  - Initial labs reviewed. - Prenatal vitamins encouraged. - Problem list reviewed and updated. - New OB counseling:  The patient has been given an overview regarding routine prenatal care.  Recommendations regarding diet, weight gain, and exercise in pregnancy were given. - Prenatal testing, optional genetic testing, and ultrasound use in pregnancy were reviewed.  Traditional genetic screening vs cell-fee DNA genetic screening discussed, including risks and benefits. Testing {requests/ordered/declines:14581}. - Benefits of Breast Feeding were discussed. The patient is encouraged to  consider nursing her baby post partum.  There are no diagnoses linked to this encounter.    Follow up in 4 weeks.    Fonda Kinder, CMA [facility]

## 2023-08-12 ENCOUNTER — Encounter: Payer: Self-pay | Admitting: Certified Nurse Midwife

## 2023-08-12 ENCOUNTER — Ambulatory Visit: Payer: BC Managed Care – PPO | Admitting: Certified Nurse Midwife

## 2023-08-12 ENCOUNTER — Other Ambulatory Visit (HOSPITAL_COMMUNITY)
Admission: RE | Admit: 2023-08-12 | Discharge: 2023-08-12 | Disposition: A | Discharge status: Home / Self Care | Payer: BC Managed Care – PPO | Source: Ambulatory Visit | Attending: Certified Nurse Midwife | Admitting: Certified Nurse Midwife

## 2023-08-12 VITALS — BP 113/78 | HR 72 | Wt 180.3 lb

## 2023-08-12 DIAGNOSIS — Z113 Encounter for screening for infections with a predominantly sexual mode of transmission: Secondary | ICD-10-CM | POA: Insufficient documentation

## 2023-08-12 DIAGNOSIS — Z3A11 11 weeks gestation of pregnancy: Secondary | ICD-10-CM

## 2023-08-12 DIAGNOSIS — Z1379 Encounter for other screening for genetic and chromosomal anomalies: Secondary | ICD-10-CM

## 2023-08-12 DIAGNOSIS — Z3481 Encounter for supervision of other normal pregnancy, first trimester: Secondary | ICD-10-CM

## 2023-08-12 DIAGNOSIS — Z0283 Encounter for blood-alcohol and blood-drug test: Secondary | ICD-10-CM

## 2023-08-12 DIAGNOSIS — Z8759 Personal history of other complications of pregnancy, childbirth and the puerperium: Secondary | ICD-10-CM | POA: Insufficient documentation

## 2023-08-12 DIAGNOSIS — Z0184 Encounter for antibody response examination: Secondary | ICD-10-CM

## 2023-08-12 DIAGNOSIS — Z130 Encounter for screening for diseases of the blood and blood-forming organs and certain disorders involving the immune mechanism: Secondary | ICD-10-CM

## 2023-08-12 DIAGNOSIS — T7589XA Other specified effects of external causes, initial encounter: Secondary | ICD-10-CM

## 2023-08-12 MED ORDER — ASPIRIN 81 MG PO TBEC: 162.0000 mg | DELAYED_RELEASE_TABLET | Freq: Every day | ORAL | 12 refills | Status: DC

## 2023-08-12 NOTE — Patient Instructions (Signed)

## 2023-08-12 NOTE — Addendum Note (Signed)
Addended by: Sheliah Hatch on: 08/12/2023 10:46 AM   Modules accepted: Orders

## 2023-08-13 LAB — URINALYSIS, ROUTINE W REFLEX MICROSCOPIC
Bilirubin, UA: NEGATIVE
Glucose, UA: NEGATIVE
Ketones, UA: NEGATIVE
Nitrite, UA: NEGATIVE
RBC, UA: NEGATIVE
Specific Gravity, UA: 1.024 (ref 1.005–1.030)
Urobilinogen, Ur: 1 mg/dL (ref 0.2–1.0)
pH, UA: 6.5 (ref 5.0–7.5)

## 2023-08-13 LAB — CBC/D/PLT+RPR+RH+ABO+RUBIGG...
Antibody Screen: NEGATIVE
Basophils Absolute: 0 10*3/uL (ref 0.0–0.2)
Basos: 0 %
EOS (ABSOLUTE): 0.1 10*3/uL (ref 0.0–0.4)
Eos: 1 %
HCV Ab: NONREACTIVE
HIV Screen 4th Generation wRfx: NONREACTIVE
Hematocrit: 40.4 % (ref 34.0–46.6)
Hemoglobin: 13.5 g/dL (ref 11.1–15.9)
Hepatitis B Surface Ag: NEGATIVE
Immature Grans (Abs): 0 10*3/uL (ref 0.0–0.1)
Immature Granulocytes: 0 %
Lymphocytes Absolute: 1.4 10*3/uL (ref 0.7–3.1)
Lymphs: 16 %
MCH: 33.7 pg — ABNORMAL HIGH (ref 26.6–33.0)
MCHC: 33.4 g/dL (ref 31.5–35.7)
MCV: 101 fL — ABNORMAL HIGH (ref 79–97)
Monocytes Absolute: 0.4 10*3/uL (ref 0.1–0.9)
Monocytes: 4 %
Neutrophils Absolute: 7 10*3/uL (ref 1.4–7.0)
Neutrophils: 79 %
Platelets: 219 10*3/uL (ref 150–450)
RBC: 4.01 x10E6/uL (ref 3.77–5.28)
RDW: 12.4 % (ref 11.7–15.4)
RPR Ser Ql: NONREACTIVE
Rh Factor: POSITIVE
Rubella Antibodies, IGG: <0.9 {index} — ABNORMAL LOW (ref >0.99)
Varicella zoster IgG: REACTIVE
WBC: 8.9 10*3/uL (ref 3.4–10.8)

## 2023-08-13 LAB — MICROSCOPIC EXAMINATION
Casts: NONE SEEN /[LPF]
Epithelial Cells (non renal): >10 /[HPF] — AB (ref 0–10)

## 2023-08-13 LAB — HEMOGLOBIN A1C
Est. average glucose Bld gHb Est-mCnc: 94 mg/dL
Hgb A1c MFr Bld: 4.9 % (ref 4.8–5.6)

## 2023-08-13 LAB — CERVICOVAGINAL ANCILLARY ONLY
Chlamydia: NEGATIVE
Comment: NEGATIVE
Comment: NORMAL
Neisseria Gonorrhea: NEGATIVE

## 2023-08-13 LAB — HCV INTERPRETATION

## 2023-08-14 LAB — MONITOR DRUG PROFILE 14(MW)
Amphetamine Scrn, Ur: NEGATIVE ng/mL
BARBITURATE SCREEN URINE: NEGATIVE ng/mL
BENZODIAZEPINE SCREEN, URINE: NEGATIVE ng/mL
Buprenorphine, Urine: NEGATIVE ng/mL
CANNABINOIDS UR QL SCN: NEGATIVE ng/mL
Cocaine (Metab) Scrn, Ur: NEGATIVE ng/mL
Creatinine(Crt), U: 166.5 mg/dL (ref 20.0–300.0)
Fentanyl, Urine: NEGATIVE pg/mL
Meperidine Screen, Urine: NEGATIVE ng/mL
Methadone Screen, Urine: NEGATIVE ng/mL
OXYCODONE+OXYMORPHONE UR QL SCN: NEGATIVE ng/mL
Opiate Scrn, Ur: NEGATIVE ng/mL
Ph of Urine: 6.4 (ref 4.5–8.9)
Phencyclidine Qn, Ur: NEGATIVE ng/mL
Propoxyphene Scrn, Ur: NEGATIVE ng/mL
SPECIFIC GRAVITY: 1.021
Tramadol Screen, Urine: NEGATIVE ng/mL

## 2023-08-14 LAB — NICOTINE SCREEN, URINE: Cotinine Ql Scrn, Ur: NEGATIVE ng/mL

## 2023-08-14 LAB — CULTURE, OB URINE

## 2023-08-14 LAB — URINE CULTURE, OB REFLEX

## 2023-08-17 LAB — MATERNIT 21 PLUS CORE, BLOOD
Fetal Fraction: 14
Result (T21): NEGATIVE
Trisomy 13 (Patau syndrome): NEGATIVE
Trisomy 18 (Edwards syndrome): NEGATIVE
Trisomy 21 (Down syndrome): NEGATIVE

## 2023-08-19 ENCOUNTER — Ambulatory Visit: Payer: BC Managed Care – PPO | Admitting: Certified Nurse Midwife

## 2023-08-19 VITALS — BP 115/63 | HR 72 | Wt 178.4 lb

## 2023-08-19 DIAGNOSIS — Z3481 Encounter for supervision of other normal pregnancy, first trimester: Secondary | ICD-10-CM

## 2023-08-19 DIAGNOSIS — Z8759 Personal history of other complications of pregnancy, childbirth and the puerperium: Secondary | ICD-10-CM

## 2023-08-19 DIAGNOSIS — Z3480 Encounter for supervision of other normal pregnancy, unspecified trimester: Secondary | ICD-10-CM

## 2023-08-19 DIAGNOSIS — Z3A12 12 weeks gestation of pregnancy: Secondary | ICD-10-CM

## 2023-08-19 LAB — POCT URINALYSIS DIPSTICK OB
Bilirubin, UA: NEGATIVE
Blood, UA: NEGATIVE
Glucose, UA: NEGATIVE
Ketones, UA: NEGATIVE
Leukocytes, UA: NEGATIVE
Nitrite, UA: NEGATIVE
Spec Grav, UA: 1.02 (ref 1.010–1.025)
Urobilinogen, UA: 0.2 U/dL
pH, UA: 5 (ref 5.0–8.0)

## 2023-08-19 NOTE — Progress Notes (Signed)
    Return Prenatal Note   Subjective   32 y.o. G3P2002 at [redacted]w[redacted]d presents for this follow-up prenatal visit.  [place] reports nausea without vomiting, eased with chewing gum.  Patient reports: Movement: Absent Contractions: Not present  Objective   Flow sheet Vitals: Pulse Rate: 72 BP: 115/63 Fetal Heart Rate (bpm): 170 Total weight gain: 6.4 oz (0.181 kg)  General Appearance  No acute distress, well appearing, and well nourished Pulmonary   Normal work of breathing Neurologic   Alert and oriented to person, place, and time Psychiatric   Mood and affect within normal limits  Assessment/Plan   Plan  32 y.o. [name] at [redacted]w[redacted]d presents for follow-up OB visit. Reviewed prenatal record including previous visit note.  Supervision of other normal pregnancy, antepartum Reviewed red flag warning signs anticipatory guidance for upcoming prenatal care.     FHT easily heard on doppler today, reviewed lab results form NOB.    Orders Placed This Encounter  Procedures   POC Urinalysis Dipstick OB   Return in 4 weeks (on 09/16/2023) for ROB.   Future Appointments  Date Time Provider Department Center  09/16/2023  8:15 AM Doreene Burke, CNM [facility] None    For next visit:  continue with routine prenatal care     Dominica Severin, CNM  12/18/248:28 AM

## 2023-08-19 NOTE — Assessment & Plan Note (Signed)
Reviewed red flag warning signs anticipatory guidance for upcoming prenatal care.

## 2023-08-19 NOTE — Patient Instructions (Signed)

## 2023-09-02 NOTE — L&D Delivery Note (Signed)
 Delivery Note   Candace Higgins is a 33 y.o. G3P2002 at [redacted]w[redacted]d Estimated Date of Delivery: 02/28/24  PRE-OPERATIVE DIAGNOSIS:  1) [redacted]w[redacted]d pregnancy.  2) A1GDM 3) BPP 4/8, Low AFI  POST-OPERATIVE DIAGNOSIS:  1) [redacted]w[redacted]d pregnancy s/p Vaginal, Spontaneous And above  Delivery Type: Vaginal, Spontaneous   Delivery Anesthesia: Epidural  Labor Complications:      ESTIMATED BLOOD LOSS: 50 ml    FINDINGS:   1) female infant, Apgar scores of 9   at 1 minute and 9   at 5 minutes and a birthweight of   ounces.     SPECIMENS:   PLACENTA:   Appearance: Intact   Removal: Spontaneous     Disposition:  discarded  CORD BLOOD: lab  DISPOSITION:  Infant left in stable condition in the delivery room, with L&D personnel and mother,  NARRATIVE SUMMARY: Labor course:  Candace Higgins is a [name] at [redacted]w[redacted]d who presented to Labor & Delivery for induction of labor due to [facility] 4/8 and A1GDM. Her initial cervical exam was 2.5/50/-2. Labor proceeded with augmentation using Cook catheter and pitocin  and she was found to be completely dilated at 0150. With excellent maternal pushing effort, she birthed a viable female infant at 0204. There was not a nuchal cord. The shoulders were birthed without difficulty. The infant was placed skin-to-skin with mother. The cord was doubly clamped and cut when pulsations ceased. The placenta delivered spontaneously and was noted to be intact with a 3VC. A perineal and vaginal examination was performed. Episiotomy/Lacerations: None Mother and baby were left in stable condition.   Dr. Madelene Schanz was immediately available for the care of this patient and informed of the delivery.  Candace Higgins, CNM 02/11/2024 2:32 AM

## 2023-09-16 ENCOUNTER — Encounter: Payer: Self-pay | Admitting: Certified Nurse Midwife

## 2023-09-16 ENCOUNTER — Encounter: Payer: BC Managed Care – PPO | Admitting: Certified Nurse Midwife

## 2023-09-16 ENCOUNTER — Ambulatory Visit: Payer: BC Managed Care – PPO | Admitting: Certified Nurse Midwife

## 2023-09-16 VITALS — BP 102/66 | HR 76 | Wt 182.5 lb

## 2023-09-16 DIAGNOSIS — Z3A16 16 weeks gestation of pregnancy: Secondary | ICD-10-CM

## 2023-09-16 DIAGNOSIS — Z1379 Encounter for other screening for genetic and chromosomal anomalies: Secondary | ICD-10-CM

## 2023-09-16 DIAGNOSIS — Z3492 Encounter for supervision of normal pregnancy, unspecified, second trimester: Secondary | ICD-10-CM | POA: Diagnosis not present

## 2023-09-16 DIAGNOSIS — Z3482 Encounter for supervision of other normal pregnancy, second trimester: Secondary | ICD-10-CM

## 2023-09-16 LAB — POCT URINALYSIS DIPSTICK OB
Appearance: NORMAL
Bilirubin, UA: NEGATIVE
Blood, UA: NEGATIVE
Glucose, UA: NEGATIVE
Ketones, UA: NEGATIVE
Nitrite, UA: NEGATIVE
Odor: NORMAL
POC,PROTEIN,UA: NEGATIVE
Spec Grav, UA: 1.015 (ref 1.010–1.025)
Urobilinogen, UA: 0.2 U/dL
pH, UA: 6 (ref 5.0–8.0)

## 2023-09-16 NOTE — Patient Instructions (Signed)
 Round Ligament Pain  The round ligaments are a pair of cord-like tissues that help support the uterus. They can become a source of pain during pregnancy as the ligaments soften and stretch as the baby grows. The pain usually begins in the second trimester (13-28 weeks) of pregnancy, and should only last for a few seconds when it occurs. However, the pain can come and go until the baby is delivered. The pain does not cause harm to the baby. Round ligament pain is usually a short, sharp, and pinching pain, but it can also be a dull, lingering, and aching pain. The pain is felt in the lower side of the abdomen or in the groin. It usually starts deep in the groin and moves up to the outside of the hip area. The pain may happen when you: Suddenly change position, such as quickly going from a sitting to standing position. Do physical activity. Cough or sneeze. Follow these instructions at home: Managing pain  When the pain starts, relax. Then, try any of these methods to help with the pain: Sit down. Flex your knees up to your abdomen. Lie on your side with one pillow under your abdomen and another pillow between your legs. Sit in a warm bath for 15-20 minutes or until the pain goes away. General instructions Watch your condition for any changes. Move slowly when you sit down or stand up. Stop or reduce your physical activities if they cause pain. Avoid long walks if they cause pain. Take over-the-counter and prescription medicines only as told by your health care provider. Keep all follow-up visits. This is important. Contact a health care provider if: Your pain does not go away with treatment. You feel pain in your back that you did not have before. Your medicine is not helping. You have a fever or chills. You have nausea or vomiting. You have diarrhea. You have pain when you urinate. Get help right away if: You have pain that is a rhythmic, cramping pain similar to labor pains. Labor  pains are usually 2 minutes apart, last for about 1 minute, and involve a bearing down feeling or pressure in your pelvis. You have vaginal bleeding. These symptoms may represent a serious problem that is an emergency. Do not wait to see if the symptoms will go away. Get medical help right away. Call your local emergency services (911 in the [place] Do not drive yourself to the hospital. Summary Round ligament pain is felt in the lower abdomen or groin. This pain usually begins in the second trimester (13-28 weeks) and should only last for a few seconds when it occurs. You may notice the pain when you suddenly change position, when you cough or sneeze, or during physical activity. Relaxing, flexing your knees to your abdomen, lying on one side, or taking a warm bath may help to get rid of the pain. Contact your health care provider if the pain does not go away. This information is not intended to replace advice given to you by your health care provider. Make sure you discuss any questions you have with your health care provider. Document Revised: 10/31/2020 Document Reviewed: 10/31/2020 Elsevier Patient Education  2024 [employer].

## 2023-09-16 NOTE — Progress Notes (Signed)
 ROB doing well, not feeling movement yet. Reassurance given. Pt is having AFP testing today. Baseline pre labs collected today as well. Discussed anatomy scan next visit. She is in agreement. Follow up 4 wks.   Candace Higgins, CNM

## 2023-09-18 ENCOUNTER — Encounter: Payer: Self-pay | Admitting: Certified Nurse Midwife

## 2023-09-18 LAB — AFP, SERUM, OPEN SPINA BIFIDA
AFP MoM: 1.68
AFP Value: 51 ng/mL
Gest. Age on Collection Date: 16.3 wk
Maternal Age At EDD: 33.8 a
OSBR Risk 1 IN: 1713
Test Results:: NEGATIVE
Weight: 183 [lb_av]

## 2023-09-18 LAB — COMPREHENSIVE METABOLIC PANEL
ALT: 32 [IU]/L (ref 0–32)
AST: 20 [IU]/L (ref 0–40)
Albumin: 4 g/dL (ref 3.9–4.9)
Alkaline Phosphatase: 78 [IU]/L (ref 44–121)
BUN/Creatinine Ratio: 13 (ref 9–23)
BUN: 8 mg/dL (ref 6–20)
Bilirubin Total: 0.6 mg/dL (ref 0.0–1.2)
CO2: 22 mmol/L (ref 20–29)
Calcium: 9 mg/dL (ref 8.7–10.2)
Chloride: 103 mmol/L (ref 96–106)
Creatinine, Ser: 0.62 mg/dL (ref 0.57–1.00)
Globulin, Total: 2.3 g/dL (ref 1.5–4.5)
Glucose: 88 mg/dL (ref 70–99)
Potassium: 4.2 mmol/L (ref 3.5–5.2)
Sodium: 137 mmol/L (ref 134–144)
Total Protein: 6.3 g/dL (ref 6.0–8.5)
eGFR: 121 mL/min/{1.73_m2} (ref >59)

## 2023-09-18 LAB — PROTEIN / CREATININE RATIO, URINE
Creatinine, Urine: 124.3 mg/dL
Protein, Ur: 9.8 mg/dL
Protein/Creat Ratio: 79 mg/g{creat} (ref 0–200)

## 2023-10-09 ENCOUNTER — Other Ambulatory Visit: Payer: Self-pay | Admitting: Certified Nurse Midwife

## 2023-10-09 DIAGNOSIS — Z3A16 16 weeks gestation of pregnancy: Secondary | ICD-10-CM

## 2023-10-09 DIAGNOSIS — Z1379 Encounter for other screening for genetic and chromosomal anomalies: Secondary | ICD-10-CM

## 2023-10-09 DIAGNOSIS — Z363 Encounter for antenatal screening for malformations: Secondary | ICD-10-CM

## 2023-10-14 ENCOUNTER — Ambulatory Visit (INDEPENDENT_AMBULATORY_CARE_PROVIDER_SITE_OTHER): Payer: BC Managed Care – PPO | Admitting: Certified Nurse Midwife

## 2023-10-14 ENCOUNTER — Ambulatory Visit (INDEPENDENT_AMBULATORY_CARE_PROVIDER_SITE_OTHER): Payer: BC Managed Care – PPO

## 2023-10-14 VITALS — BP 121/72 | HR 77 | Wt 189.0 lb

## 2023-10-14 DIAGNOSIS — Z3A20 20 weeks gestation of pregnancy: Secondary | ICD-10-CM | POA: Diagnosis not present

## 2023-10-14 DIAGNOSIS — Z363 Encounter for antenatal screening for malformations: Secondary | ICD-10-CM | POA: Diagnosis not present

## 2023-10-14 DIAGNOSIS — Z3482 Encounter for supervision of other normal pregnancy, second trimester: Secondary | ICD-10-CM

## 2023-10-14 DIAGNOSIS — Z3480 Encounter for supervision of other normal pregnancy, unspecified trimester: Secondary | ICD-10-CM

## 2023-10-14 LAB — POCT URINALYSIS DIPSTICK OB
Bilirubin, UA: NEGATIVE
Blood, UA: NEGATIVE
Glucose, UA: NEGATIVE
Ketones, UA: NEGATIVE
Leukocytes, UA: NEGATIVE
Nitrite, UA: NEGATIVE
POC,PROTEIN,UA: NEGATIVE
Spec Grav, UA: <=1.005 — AB (ref 1.010–1.025)
Urobilinogen, UA: 0.2 U/dL
pH, UA: 6.5 (ref 5.0–8.0)

## 2023-10-14 NOTE — Progress Notes (Signed)
    Return Prenatal Note   Subjective   33 y.o. [name] at [redacted]w[redacted]d presents for this follow-up prenatal visit.  Patient feeling well, felt flutters starting last week. Anatomy ultrasound today, no anomalies noted but incomplete for spine views. Patient reports: Movement: Present Contractions: Not present  Objective   Flow sheet Vitals: Pulse Rate: 77 BP: 121/72 Fundal Height:  (@U ) Fetal Heart Rate (bpm): 155 Total weight gain: 11 lb (4.99 kg)  General Appearance  No acute distress, well appearing, and well nourished Pulmonary   Normal work of breathing Neurologic   Alert and oriented to person, place, and time Psychiatric   Mood and affect within normal limits  Assessment/Plan   Plan  33 y.o. [name] at [redacted]w[redacted]d presents for follow-up OB visit. Reviewed prenatal record including previous visit note.  Supervision of other normal pregnancy, antepartum Red flag symptoms reviewed. Will complete anatomy with follow up ultrasound in ~37m.      Orders Placed This Encounter  Procedures   US OB Follow Up    Standing Status:   Future    Expected Date:   11/11/2023    Expiration Date:   01/11/2024    Reason for exam::   completion of anatomy ultrasound for spine views.    Preferred imaging location?:   Internal   POC Urinalysis Dipstick OB   Return in 4 weeks (on 11/11/2023) for ROB, completion of anatomy ultrasound for spine views.   Future Appointments  Date Time Provider Department Center  10/14/2023  2:35 PM Tiarra, Anastacio, CNM [facility] None  11/12/2023  1:00 PM [facility] [place] [facility] None  11/12/2023  2:35 PM Dominica Severin, CNM [facility] None    For next visit:  continue with routine prenatal care     Dominica Severin, CNM  02/12/251:49 PM

## 2023-10-14 NOTE — Assessment & Plan Note (Signed)
Red flag symptoms reviewed. Will complete anatomy with follow up ultrasound in ~36m.

## 2023-10-14 NOTE — Patient Instructions (Signed)
Second Trimester of Pregnancy  The second trimester of pregnancy is from week 14 through week 27. This is months 4 through 6 of pregnancy. During the second trimester: Morning sickness is less or has stopped. You may have more energy. You may feel hungry more often. At this time, your unborn baby is growing very fast. At the end of the sixth month, the unborn baby may be up to 12 inches long and weigh about 1 pounds. You will likely start to feel the baby move between 16 and 20 weeks of pregnancy. Body changes during your second trimester Your body continues to change during this time. The changes usually go away after your baby is born. Physical changes You will gain more weight. Your belly will get bigger. You may begin to get stretch marks on your hips, belly, and breasts. Your breasts will keep growing and may hurt. You may get dark spots or blotches on your face. A dark line from your belly button to the pubic area may appear. This line is called linea nigra. Your hair may grow faster and get thicker. Health changes You may have headaches. You may have heartburn. You may pee more often. You may have swollen, bulging veins (varicose veins). You may have trouble pooping (constipation), or swollen veins in the butt that can itch or get painful (hemorrhoids). You may have back pain. This is caused by: Weight gain. Pregnancy hormones that are relaxing the joints in your pelvis. Follow these instructions at home: Medicines Talk to your health care provider if you're taking medicines. Ask if the medicines are safe to take during pregnancy. Your provider may change the medicines that you take. Do not take any medicines unless told to by your provider. Take a prenatal vitamin that has at least 600 micrograms (mcg) of folic acid. Do not use herbal medicines, illegal drugs, or medicines that are not approved by your provider. Eating and drinking While you're pregnant your body needs  extra food for your growing baby. Talk with your provider about what to eat while pregnant. Activity Most women are able to exercise during pregnancy. Exercises may need to change as your pregnancy goes on. Talk to your provider about your activities and exercise routines. Relieving pain and discomfort Wear a good, supportive bra if your breasts hurt. Rest with your legs raised if you have leg cramps or low back pain. Take warm sitz baths to soothe pain from hemorrhoids. Use hemorrhoid cream if your provider says it's okay. Do not douche. Do not use tampons or scented pads. Do not use hot tubs, steam rooms, or saunas. Safety Wear your seatbelt at all times when you're in a car. Talk to your provider if someone hits you, hurts you, or yells at you. Talk with your provider if you're feeling sad or have thoughts of hurting yourself. Lifestyle Certain things can be harmful while you're pregnant. It's best to avoid the following: Do not drink alcohol,smoke, vape, or use products with nicotine or tobacco in them. If you need help quitting, talk with your provider. Avoid cat litter boxes and soil used by cats. These things carry germs that can cause harm to your pregnancy and your baby. General instructions Keep all follow-up visits. It helps you and your unborn baby stay as healthy as possible. Write down your questions. Take them to your prenatal visits. Your provider will: Talk with you about your overall health. Give you advice or refer you to specialists who can help with different needs,  including: Prenatal education classes. Mental health and counseling. Foods and healthy eating. Ask for help if you need help with food. Where to find more information American Pregnancy Association: americanpregnancy.org [employer] of Obstetricians and Gynecologists: acog.org Office on [employer] Health: [employer] Contact a health care provider if: You have a headache that does not go away  when you take medicine. You have any of these problems: You can't eat or drink. You throw up or feel like you may throw up. You have watery poop (diarrhea) for 2 days or more. You have pain when you pee or your pee smells bad. You have been sick for 2 days or more and are not getting better. Contact your provider right away if: You have any of these coming from your vagina: Abnormal discharge. Bad-smelling fluid. Bleeding. Your baby is moving less than usual. You have contractions, belly cramping, or have pain in your pelvis or lower back. You have symptoms of high blood pressure or preeclampsia. These include: A severe, throbbing headache that does not go away. Sudden or extreme swelling of your face, hands, legs, or feet. Vision problems: You see spots. You have blurry vision. Your eyes are sensitive to light. If you can't reach the provider, go to an urgent care or emergency room. Get help right away if: You faint, become confused, or can't think clearly. You have chest pain or trouble breathing. You have any kind of injury, such as from a fall or a car crash. These symptoms may be an emergency. Call 911 right away. Do not wait to see if the symptoms will go away. Do not drive yourself to the hospital. This information is not intended to replace advice given to you by your health care provider. Make sure you discuss any questions you have with your health care provider. Document Revised: 05/21/2023 Document Reviewed: 12/19/2022 Elsevier Patient Education  2024 [employer].

## 2023-11-12 ENCOUNTER — Ambulatory Visit: Payer: BC Managed Care – PPO

## 2023-11-12 ENCOUNTER — Ambulatory Visit (INDEPENDENT_AMBULATORY_CARE_PROVIDER_SITE_OTHER): Payer: BC Managed Care – PPO | Admitting: Certified Nurse Midwife

## 2023-11-12 VITALS — BP 115/76 | HR 80 | Wt 195.6 lb

## 2023-11-12 DIAGNOSIS — Z131 Encounter for screening for diabetes mellitus: Secondary | ICD-10-CM

## 2023-11-12 DIAGNOSIS — Z3482 Encounter for supervision of other normal pregnancy, second trimester: Secondary | ICD-10-CM | POA: Diagnosis not present

## 2023-11-12 DIAGNOSIS — Z3A24 24 weeks gestation of pregnancy: Secondary | ICD-10-CM | POA: Diagnosis not present

## 2023-11-12 DIAGNOSIS — Z3480 Encounter for supervision of other normal pregnancy, unspecified trimester: Secondary | ICD-10-CM

## 2023-11-12 DIAGNOSIS — Z113 Encounter for screening for infections with a predominantly sexual mode of transmission: Secondary | ICD-10-CM

## 2023-11-12 DIAGNOSIS — Z363 Encounter for antenatal screening for malformations: Secondary | ICD-10-CM | POA: Diagnosis not present

## 2023-11-12 DIAGNOSIS — Z114 Encounter for screening for human immunodeficiency virus [HIV]: Secondary | ICD-10-CM

## 2023-11-12 NOTE — Assessment & Plan Note (Signed)
 Red flag symptoms reviewed. Prepared for GDM & 28 week labs at next visit.

## 2023-11-12 NOTE — Progress Notes (Signed)
    Return Prenatal Note   Subjective   33 y.o. [id number] at [redacted]w[redacted]d presents for this follow-up prenatal visit.  Patient feeling well. Anatomy now complete after follow up ultrasound today.  Patient reports: Movement: Present Contractions: Not present  Objective   Flow sheet Vitals: Pulse Rate: 80 BP: 115/76 Fundal Height: 25 cm Fetal Heart Rate (bpm): 150 Total weight gain: 17 lb 9.6 oz (7.983 kg)  General Appearance  No acute distress, well appearing, and well nourished Pulmonary   Normal work of breathing Neurologic   Alert and oriented to person, place, and time Psychiatric   Mood and affect within normal limits  Assessment/Plan   Plan  33 y.o. W4X3244 at [redacted]w[redacted]d presents for follow-up OB visit. Reviewed prenatal record including previous visit note.  Supervision of other normal pregnancy, antepartum Red flag symptoms reviewed. Prepared for GDM & 28 week labs at next visit.      Orders Placed This Encounter  Procedures   28 Week RH+Panel    Standing Status:   Future    Expected Date:   12/10/2023    Expiration Date:   11/08/2024   Return in 4 weeks (on 12/10/2023) for ROB & GDM screening.   Future Appointments  Date Time Provider Department Center  12/10/2023  8:00 AM [facility] LAB [facility] None  12/10/2023  8:35 AM Julieanne Manson, MD [facility] None    For next visit:  ROB with 1 hour glucola, third trimester labs, and Tdap     Dominica Severin, CNM  03/13/253:42 PM

## 2023-11-12 NOTE — Patient Instructions (Signed)
 Second Trimester of Pregnancy  The second trimester of pregnancy is from week 14 through week 27. This is months 4 through 6 of pregnancy. During the second trimester: Morning sickness is less or has stopped. You may have more energy. You may feel hungry more often. At this time, your unborn baby is growing very fast. At the end of the sixth month, the unborn baby may be up to 12 inches long and weigh about 1 pounds. You will likely start to feel the baby move between 16 and 20 weeks of pregnancy. Body changes during your second trimester Your body continues to change during this time. The changes usually go away after your baby is born. Physical changes You will gain more weight. Your belly will get bigger. You may begin to get stretch marks on your hips, belly, and breasts. Your breasts will keep growing and may hurt. You may get dark spots or blotches on your face. A dark line from your belly button to the pubic area may appear. This line is called linea nigra. Your hair may grow faster and get thicker. Health changes You may have headaches. You may have heartburn. You may pee more often. You may have swollen, bulging veins (varicose veins). You may have trouble pooping (constipation), or swollen veins in the butt that can itch or get painful (hemorrhoids). You may have back pain. This is caused by: Weight gain. Pregnancy hormones that are relaxing the joints in your pelvis. Follow these instructions at home: Medicines Talk to your health care provider if you're taking medicines. Ask if the medicines are safe to take during pregnancy. Your provider may change the medicines that you take. Do not take any medicines unless told to by your provider. Take a prenatal vitamin that has at least 600 micrograms (mcg) of folic acid. Do not use herbal medicines, illegal drugs, or medicines that are not approved by your provider. Eating and drinking While you're pregnant your body needs  extra food for your growing baby. Talk with your provider about what to eat while pregnant. Activity Most women are able to exercise during pregnancy. Exercises may need to change as your pregnancy goes on. Talk to your provider about your activities and exercise routines. Relieving pain and discomfort Wear a good, supportive bra if your breasts hurt. Rest with your legs raised if you have leg cramps or low back pain. Take warm sitz baths to soothe pain from hemorrhoids. Use hemorrhoid cream if your provider says it's okay. Do not douche. Do not use tampons or scented pads. Do not use hot tubs, steam rooms, or saunas. Safety Wear your seatbelt at all times when you're in a car. Talk to your provider if someone hits you, hurts you, or yells at you. Talk with your provider if you're feeling sad or have thoughts of hurting yourself. Lifestyle Certain things can be harmful while you're pregnant. It's best to avoid the following: Do not drink alcohol,smoke, vape, or use products with nicotine or tobacco in them. If you need help quitting, talk with your provider. Avoid cat litter boxes and soil used by cats. These things carry germs that can cause harm to your pregnancy and your baby. General instructions Keep all follow-up visits. It helps you and your unborn baby stay as healthy as possible. Write down your questions. Take them to your prenatal visits. Your provider will: Talk with you about your overall health. Give you advice or refer you to specialists who can help with different needs,  including: Prenatal education classes. Mental health and counseling. Foods and healthy eating. Ask for help if you need help with food. Where to find more information American Pregnancy Association: americanpregnancy.org [employer]: acog.org Office on [employer] Health: [employer] Contact a health care provider if: You have a headache that does not go away  when you take medicine. You have any of these problems: You can't eat or drink. You throw up or feel like you may throw up. You have watery poop (diarrhea) for 2 days or more. You have pain when you pee or your pee smells bad. You have been sick for 2 days or more and are not getting better. Contact your provider right away if: You have any of these coming from your vagina: Abnormal discharge. Bad-smelling fluid. Bleeding. Your baby is moving less than usual. You have contractions, belly cramping, or have pain in your pelvis or lower back. You have symptoms of high blood pressure or preeclampsia. These include: A severe, throbbing headache that does not go away. Sudden or extreme swelling of your face, hands, legs, or feet. Vision problems: You see spots. You have blurry vision. Your eyes are sensitive to light. If you can't reach the provider, go to an urgent care or emergency room. Get help right away if: You faint, become confused, or can't think clearly. You have chest pain or trouble breathing. You have any kind of injury, such as from a fall or a car crash. These symptoms may be an emergency. Call 911 right away. Do not wait to see if the symptoms will go away. Do not drive yourself to the hospital. This information is not intended to replace advice given to you by your health care provider. Make sure you discuss any questions you have with your health care provider. Document Revised: 05/21/2023 Document Reviewed: 12/19/2022 Elsevier Patient Education  2024 [employer].

## 2023-11-22 ENCOUNTER — Observation Stay
Admission: EM | Admit: 2023-11-22 | Discharge: 2023-11-22 | Disposition: A | Discharge status: Home / Self Care | Attending: Obstetrics | Admitting: Certified Nurse Midwife

## 2023-11-22 ENCOUNTER — Encounter: Payer: Self-pay | Admitting: Certified Nurse Midwife

## 2023-11-22 ENCOUNTER — Other Ambulatory Visit: Payer: Self-pay

## 2023-11-22 DIAGNOSIS — S93401A Sprain of unspecified ligament of right ankle, initial encounter: Secondary | ICD-10-CM | POA: Diagnosis not present

## 2023-11-22 DIAGNOSIS — O368120 Decreased fetal movements, second trimester, not applicable or unspecified: Secondary | ICD-10-CM | POA: Insufficient documentation

## 2023-11-22 DIAGNOSIS — Z7982 Long term (current) use of aspirin: Secondary | ICD-10-CM | POA: Diagnosis not present

## 2023-11-22 DIAGNOSIS — Z3A26 26 weeks gestation of pregnancy: Secondary | ICD-10-CM | POA: Insufficient documentation

## 2023-11-22 DIAGNOSIS — O9A219 Injury, poisoning and certain other consequences of external causes complicating pregnancy, unspecified trimester: Secondary | ICD-10-CM | POA: Diagnosis present

## 2023-11-22 DIAGNOSIS — Z3480 Encounter for supervision of other normal pregnancy, unspecified trimester: Principal | ICD-10-CM

## 2023-11-22 DIAGNOSIS — O9A212 Injury, poisoning and certain other consequences of external causes complicating pregnancy, second trimester: Secondary | ICD-10-CM | POA: Diagnosis not present

## 2023-11-22 DIAGNOSIS — W101XXA Fall (on)(from) sidewalk curb, initial encounter: Secondary | ICD-10-CM | POA: Insufficient documentation

## 2023-11-22 MED ORDER — ACETAMINOPHEN 325 MG PO TABS: 650.0000 mg | ORAL_TABLET | ORAL | Status: DC | PRN

## 2023-11-22 NOTE — OB Triage Note (Signed)
 Discharged home. Left floor ambulatory with husband. Candace Higgins

## 2023-11-22 NOTE — OB Triage Note (Signed)
 Patient presented to L&D for evaluation after she fell on her left side at 1400, states she isn't feeling fetal movements.  Denies vaginal bleeding or leaking of fluid

## 2023-11-22 NOTE — Discharge Summary (Signed)
 LABOR & DELIVERY OB TRIAGE NOTE  SUBJECTIVE  HPI Candace Higgins is a 33 y.o. G3P2002 at [redacted]w[redacted]d who presents to Labor & Delivery for fall at 2pm. She reports walking up sidewalk and felt her ankle twist and give out, was wearing wedge heels. Right leg & foot with scrape, not actively bleeding, no visible bruising then fell to right side, no contact to abdomen. Had decreased movement initially after fall, now feeling movement in triage. Denies contractions, cramping, loss of fluid or vaginal bleeding. Pregnancy complicated by history of pre-eclampsia in first pregnancy, taking daily ASA 162mg .  OB History     Gravida  3   Para  2   Term  2   Preterm      AB      Living  2      SAB      IAB      Ectopic      Multiple  0   Live Births  2        Obstetric Comments  First menstrual period age 23         PRN Meds:.acetaminophen  OBJECTIVE  BP 116/65   Pulse 86   Temp 98.1 F (36.7 C) (Oral)   Resp 17   LMP 05/24/2023 (Exact Date)   General: A&Ox4, NAD Heart: regular rate Lungs: normal work of breathing Abdomen: soft, gravid, nontender Skin: hemostatic superficial scrape to top of right foot & calf, no bruising noted Cervical exam:   deferred  NST I reviewed the NST and it was reactive and appropriate for gestational age.  Baseline: 150 Variability: moderate Accelerations: present, 10x10 Decelerations:none Toco: quiet, soft resting tone Category I  ASSESSMENT Impression  1) Pregnancy at [name], [redacted]w[redacted]d, Estimated Date of Delivery: 02/28/24 2) Reassuring maternal/fetal status s/p fall at 2pm today.  PLAN Discharge home with preterm labor & fetal movement precautions. Reviewed recommendation for 4h CEFM after time of fall, Melani felt reassured at 3h44m after fall and requested discharge home. Given reassuring, reactive and appropriate for gestational age monitoring without contractions or uterine irritability discharge at this time felt to be  acceptable.  CRISSA SOWDER, CNM 11/22/23  4:16 PM

## 2023-12-09 NOTE — Progress Notes (Unsigned)
 m

## 2023-12-10 ENCOUNTER — Ambulatory Visit (INDEPENDENT_AMBULATORY_CARE_PROVIDER_SITE_OTHER): Admitting: Obstetrics

## 2023-12-10 ENCOUNTER — Other Ambulatory Visit

## 2023-12-10 VITALS — BP 113/62 | HR 93 | Wt 202.0 lb

## 2023-12-10 DIAGNOSIS — Z113 Encounter for screening for infections with a predominantly sexual mode of transmission: Secondary | ICD-10-CM

## 2023-12-10 DIAGNOSIS — O3663X0 Maternal care for excessive fetal growth, third trimester, not applicable or unspecified: Secondary | ICD-10-CM | POA: Diagnosis not present

## 2023-12-10 DIAGNOSIS — Z114 Encounter for screening for human immunodeficiency virus [HIV]: Secondary | ICD-10-CM | POA: Diagnosis not present

## 2023-12-10 DIAGNOSIS — Z3480 Encounter for supervision of other normal pregnancy, unspecified trimester: Secondary | ICD-10-CM | POA: Diagnosis not present

## 2023-12-10 DIAGNOSIS — Z23 Encounter for immunization: Secondary | ICD-10-CM

## 2023-12-10 DIAGNOSIS — Z131 Encounter for screening for diabetes mellitus: Secondary | ICD-10-CM

## 2023-12-10 DIAGNOSIS — O3660X0 Maternal care for excessive fetal growth, unspecified trimester, not applicable or unspecified: Secondary | ICD-10-CM | POA: Insufficient documentation

## 2023-12-10 DIAGNOSIS — Z3A28 28 weeks gestation of pregnancy: Secondary | ICD-10-CM

## 2023-12-10 DIAGNOSIS — Z8759 Personal history of other complications of pregnancy, childbirth and the puerperium: Secondary | ICD-10-CM

## 2023-12-12 LAB — 28 WEEK RH+PANEL
Basophils Absolute: 0 10*3/uL (ref 0.0–0.2)
Basos: 0 %
EOS (ABSOLUTE): 0.1 10*3/uL (ref 0.0–0.4)
Eos: 1 %
Gestational Diabetes Screen: 175 mg/dL — ABNORMAL HIGH (ref 70–139)
HIV Screen 4th Generation wRfx: NONREACTIVE
Hematocrit: 35.3 % (ref 34.0–46.6)
Hemoglobin: 11.9 g/dL (ref 11.1–15.9)
Immature Grans (Abs): 0.1 10*3/uL (ref 0.0–0.1)
Immature Granulocytes: 1 %
Lymphocytes Absolute: 1.2 10*3/uL (ref 0.7–3.1)
Lymphs: 12 %
MCH: 34.1 pg — ABNORMAL HIGH (ref 26.6–33.0)
MCHC: 33.7 g/dL (ref 31.5–35.7)
MCV: 101 fL — ABNORMAL HIGH (ref 79–97)
Monocytes Absolute: 0.3 10*3/uL (ref 0.1–0.9)
Monocytes: 3 %
Neutrophils Absolute: 8.5 10*3/uL — ABNORMAL HIGH (ref 1.4–7.0)
Neutrophils: 83 %
Platelets: 179 10*3/uL (ref 150–450)
RBC: 3.49 x10E6/uL — ABNORMAL LOW (ref 3.77–5.28)
RDW: 12.3 % (ref 11.7–15.4)
RPR Ser Ql: NONREACTIVE
WBC: 10.3 10*3/uL (ref 3.4–10.8)

## 2023-12-13 ENCOUNTER — Encounter: Payer: Self-pay | Admitting: Certified Nurse Midwife

## 2023-12-13 ENCOUNTER — Other Ambulatory Visit: Payer: Self-pay | Admitting: Certified Nurse Midwife

## 2023-12-13 DIAGNOSIS — O99810 Abnormal glucose complicating pregnancy: Secondary | ICD-10-CM

## 2023-12-13 DIAGNOSIS — Z131 Encounter for screening for diabetes mellitus: Secondary | ICD-10-CM

## 2023-12-13 NOTE — Progress Notes (Signed)
3h ordered

## 2023-12-15 ENCOUNTER — Other Ambulatory Visit

## 2023-12-15 DIAGNOSIS — O99810 Abnormal glucose complicating pregnancy: Secondary | ICD-10-CM | POA: Diagnosis not present

## 2023-12-15 DIAGNOSIS — Z131 Encounter for screening for diabetes mellitus: Secondary | ICD-10-CM | POA: Diagnosis not present

## 2023-12-16 ENCOUNTER — Encounter: Payer: Self-pay | Admitting: Certified Nurse Midwife

## 2023-12-16 ENCOUNTER — Other Ambulatory Visit: Payer: Self-pay | Admitting: Certified Nurse Midwife

## 2023-12-16 DIAGNOSIS — O24410 Gestational diabetes mellitus in pregnancy, diet controlled: Secondary | ICD-10-CM

## 2023-12-16 LAB — GESTATIONAL GLUCOSE TOLERANCE
Glucose, Fasting: 77 mg/dL (ref 70–94)
Glucose, GTT - 1 Hour: 224 mg/dL — ABNORMAL HIGH (ref 70–179)
Glucose, GTT - 2 Hour: 159 mg/dL — ABNORMAL HIGH (ref 70–154)
Glucose, GTT - 3 Hour: 109 mg/dL (ref 70–139)

## 2023-12-16 MED ORDER — BLOOD GLUCOSE MONITORING SUPPL DEVI: 1.0000 | Freq: Four times a day (QID) | 0 refills | Status: DC

## 2023-12-16 MED ORDER — BLOOD GLUCOSE TEST VI STRP
1.0000 | ORAL_STRIP | Freq: Four times a day (QID) | 3 refills | Status: AC
Start: 2023-12-16 — End: 2024-04-14

## 2023-12-16 MED ORDER — LANCETS MISC. MISC: 1.0000 | Freq: Three times a day (TID) | 2 refills | Status: AC

## 2023-12-16 MED ORDER — LANCET DEVICE MISC: 1.0000 | Freq: Three times a day (TID) | 0 refills | Status: AC

## 2023-12-22 ENCOUNTER — Ambulatory Visit: Admitting: Certified Nurse Midwife

## 2023-12-22 VITALS — BP 110/74 | HR 81 | Wt 200.5 lb

## 2023-12-22 DIAGNOSIS — Z3480 Encounter for supervision of other normal pregnancy, unspecified trimester: Secondary | ICD-10-CM

## 2023-12-22 DIAGNOSIS — O24410 Gestational diabetes mellitus in pregnancy, diet controlled: Secondary | ICD-10-CM | POA: Diagnosis not present

## 2023-12-22 DIAGNOSIS — Z3A30 30 weeks gestation of pregnancy: Secondary | ICD-10-CM

## 2023-12-23 ENCOUNTER — Encounter: Attending: Certified Nurse Midwife | Admitting: Dietician

## 2023-12-23 ENCOUNTER — Encounter: Payer: Self-pay | Admitting: Dietician

## 2023-12-23 DIAGNOSIS — O24410 Gestational diabetes mellitus in pregnancy, diet controlled: Secondary | ICD-10-CM | POA: Diagnosis not present

## 2023-12-23 DIAGNOSIS — Z713 Dietary counseling and surveillance: Secondary | ICD-10-CM | POA: Diagnosis not present

## 2023-12-23 NOTE — Progress Notes (Signed)
    Return Prenatal Note   Subjective   33 y.o. G3P2002 at [redacted]w[redacted]d presents for this follow-up prenatal visit.  Patient feeling well, active baby. Started checking blood sugars, making dietary modifications. Has GDM class tomorrow morning. Has also started to increase physical activity. Patient reports: Movement: Present Contractions: Not present  Date Fasting Breakfast Lunch Dinner  04/19 86 95 112 94  04/20  95  113 80  94  04/21 97 99  116 111  04/22 86 102     Highest /lowest fasting: 86/97 2 hour post prandial Highest/lowest Breakfast: 94/113 Highest lowest Lunch: 80/116 Highest/lowest Dinner: 94/111  Objective   Flow sheet Vitals: Pulse Rate: 81 BP: 110/74 Fundal Height: 31 cm Fetal Heart Rate (bpm): 140 Presentation: Vertex Total weight gain: 22 lb 8 oz (10.2 kg)  General Appearance  No acute distress, well appearing, and well nourished Pulmonary   Normal work of breathing Neurologic   Alert and oriented to person, place, and time Psychiatric   Mood and affect within normal limits  Assessment/Plan   Plan  33 y.o. [name] at [redacted]w[redacted]d presents for follow-up OB visit. Reviewed prenatal record including previous visit note.  Supervision of other normal pregnancy, antepartum Reviewed kick counts and preterm labor warning signs. Instructed to call office or come to hospital with persistent headache, vision changes, regular contractions, leaking of fluid, decreased fetal movement or vaginal bleeding.   Diet controlled gestational diabetes mellitus (GDM) in third trimester All postprandial WDL, 2/4 fastings elevated (highest 97), consider increased protein before bed. Reviewed guidelines for starting medication, if needed, but encouraged by initial readings. Follow up growth ultrasound already scheduled.       No orders of the defined types were placed in this encounter.  Return in 2 weeks (on 01/05/2024) for ROB.   Future Appointments  Date Time Provider Department  Center  01/07/2024  2:00 PM [facility] US  1 [facility] None  01/07/2024  3:15 PM Phylliss Brenner, CNM [facility] None    For next visit:  continue with routine prenatal care     Forestine Igo, CNM

## 2023-12-23 NOTE — Assessment & Plan Note (Signed)
 All postprandial WDL, 2/4 fastings elevated (highest 97), consider increased protein before bed. Reviewed guidelines for starting medication, if needed, but encouraged by initial readings. Follow up growth ultrasound already scheduled.

## 2023-12-23 NOTE — Assessment & Plan Note (Signed)
 Reviewed kick counts and preterm labor warning signs. Instructed to call office or come to hospital with persistent headache, vision changes, regular contractions, leaking of fluid, decreased fetal movement or vaginal bleeding.

## 2023-12-23 NOTE — Progress Notes (Signed)
 Patient was seen on 12/23/2023 for Gestational Diabetes self-management class at the [facility]. The following learning objectives were met by the patient during this course:  States the definition of Gestational Diabetes States why dietary management is important in controlling blood glucose Describes the effects each nutrient has on blood glucose levels Demonstrates ability to create a balanced meal plan Demonstrates carbohydrate counting  States when to check blood glucose levels Demonstrates proper blood glucose monitoring techniques States the effect of stress and exercise on blood glucose levels States the importance of limiting caffeine and abstaining from alcohol and smoking   Patient has a meter prior to visit. Patient is testing pre breakfast and 2 hours after each meal. Blood glucose today in class 101 mg/dL  Patient instructed to monitor glucose levels: FBS: 60 - <95 1 hour: <140 2 hour: <120  *Patient received handouts: Nutrition Diabetes and Pregnancy General Nutrition Guidelines during Pregnancy Blood glucose log Food List  Patient will be seen for follow-up as needed.

## 2023-12-24 ENCOUNTER — Encounter

## 2023-12-31 ENCOUNTER — Encounter: Payer: Self-pay | Admitting: Certified Nurse Midwife

## 2024-01-07 ENCOUNTER — Ambulatory Visit (INDEPENDENT_AMBULATORY_CARE_PROVIDER_SITE_OTHER): Admitting: Obstetrics

## 2024-01-07 ENCOUNTER — Ambulatory Visit

## 2024-01-07 ENCOUNTER — Encounter: Payer: Self-pay | Admitting: Obstetrics

## 2024-01-07 VITALS — BP 109/75 | HR 77 | Wt 204.0 lb

## 2024-01-07 DIAGNOSIS — O3663X0 Maternal care for excessive fetal growth, third trimester, not applicable or unspecified: Secondary | ICD-10-CM

## 2024-01-07 DIAGNOSIS — O24410 Gestational diabetes mellitus in pregnancy, diet controlled: Secondary | ICD-10-CM | POA: Diagnosis not present

## 2024-01-07 DIAGNOSIS — Z3A35 35 weeks gestation of pregnancy: Secondary | ICD-10-CM | POA: Diagnosis not present

## 2024-01-07 DIAGNOSIS — Z3480 Encounter for supervision of other normal pregnancy, unspecified trimester: Secondary | ICD-10-CM

## 2024-01-07 DIAGNOSIS — Z3A32 32 weeks gestation of pregnancy: Secondary | ICD-10-CM

## 2024-01-07 DIAGNOSIS — O3660X0 Maternal care for excessive fetal growth, unspecified trimester, not applicable or unspecified: Secondary | ICD-10-CM

## 2024-01-07 DIAGNOSIS — Z8759 Personal history of other complications of pregnancy, childbirth and the puerperium: Secondary | ICD-10-CM

## 2024-01-07 NOTE — Progress Notes (Signed)
    Return Prenatal Note   Assessment/Plan   Plan  33 y.o. G3P2002 at [redacted]w[redacted]d presents for follow-up OB visit. Reviewed prenatal record including previous visit note.  Excessive fetal growth affecting management of mother, antepartum -Growth in 98th%ile today. F/u scan in 4 weeks -Discussed that management of pregnancy typically does not change for suspected macrosomia unless there are other complications  Diet controlled gestational diabetes mellitus (GDM) in third trimester -More than 50% of blood sugars are WNL -Continue healthy dietary choices and physical activity  Supervision of other normal pregnancy, antepartum -Reviewed kick counts and preterm labor warning signs. Instructed to call office or come to hospital with persistent headache, vision changes, regular contractions, leaking of fluid, decreased fetal movement or vaginal bleeding.     Orders Placed This Encounter  Procedures   US  OB Follow Up    Standing Status:   Future    Expected Date:   02/07/2024    Expiration Date:   01/06/2025    Reason for Exam (SYMPTOM  OR DIAGNOSIS REQUIRED):   growth    Preferred Imaging Location?:   [facility]   Return in about 2 weeks (around 01/21/2024).   Future Appointments  Date Time Provider Department Center  01/21/2024  1:15 PM Forestine Igo, CNM [facility] None    For next visit:  Routine prenatal care    Subjective   Candace Higgins has been keeping good track of her sugars and figuring out what works for her. She has questions about the baby's growth. She has birthed a 9 lb baby without complication in the past. She is having some hip pain but otherwise feels well.  Movement: Present Contractions: Not present  Objective   Flow sheet Vitals: Pulse Rate: 77 BP: 109/75 Fundal Height: 33 cm Fetal Heart Rate (bpm): 154 Total weight gain: 26 lb (11.8 kg)  General Appearance  No acute distress, well appearing, and well nourished Pulmonary   Normal work of  breathing Neurologic   Alert and oriented to person, place, and time Psychiatric   Mood and affect within normal limits  Josue Nip, CNM 01/07/24 3:48 PM

## 2024-01-07 NOTE — Assessment & Plan Note (Addendum)
-  Growth in 98th%ile today. F/u scan in 4 weeks -Discussed that management of pregnancy typically does not change for suspected macrosomia unless there are other complications

## 2024-01-07 NOTE — Progress Notes (Signed)
 Date Fasting Breakfast Lunch Dinner  04/24 96 126 96 107  04/25  91  100 109  98  04/26 94 123  125 110  04/27 95 119  107 113  04/28 93 97 106 130  04/29 93 129 124 92  04/30 97 119  131  108  Highest /lowest fasting:96/91 2 hour post prandial Highest/lowest Breakfast:126/97 Highest lowest Lunch:125/131 Highest/lowest Dinner: 130/98

## 2024-01-07 NOTE — Assessment & Plan Note (Signed)
 Reviewed kick counts and preterm labor warning signs. Instructed to call office or come to hospital with persistent headache, vision changes, regular contractions, leaking of fluid, decreased fetal movement or vaginal bleeding.

## 2024-01-07 NOTE — Assessment & Plan Note (Signed)
-  More than 50% of blood sugars are WNL -Continue healthy dietary choices and physical activity

## 2024-01-21 ENCOUNTER — Encounter: Payer: Self-pay | Admitting: Certified Nurse Midwife

## 2024-01-21 ENCOUNTER — Ambulatory Visit (INDEPENDENT_AMBULATORY_CARE_PROVIDER_SITE_OTHER): Admitting: Certified Nurse Midwife

## 2024-01-21 VITALS — BP 118/76 | HR 74 | Wt 204.9 lb

## 2024-01-21 DIAGNOSIS — Z3A34 34 weeks gestation of pregnancy: Secondary | ICD-10-CM

## 2024-01-21 DIAGNOSIS — O24410 Gestational diabetes mellitus in pregnancy, diet controlled: Secondary | ICD-10-CM

## 2024-01-21 DIAGNOSIS — Z3480 Encounter for supervision of other normal pregnancy, unspecified trimester: Secondary | ICD-10-CM

## 2024-01-21 NOTE — Progress Notes (Signed)
 11

## 2024-01-21 NOTE — Assessment & Plan Note (Signed)
 Reviewed kick counts and preterm labor warning signs. Instructed to call office or come to hospital with persistent headache, vision changes, regular contractions, leaking of fluid, decreased fetal movement or vaginal bleeding.

## 2024-01-21 NOTE — Patient Instructions (Signed)
 Third Trimester of Pregnancy  The third trimester of pregnancy is from week 28 through week 40. This is months 7 through 9. The third trimester is a time when your baby is growing fast. Body changes during your third trimester Your body continues to change during this time. The changes usually go away after your baby is born. Physical changes You will continue to gain weight. You may get stretch marks on your hips, belly, and breasts. Your breasts will keep growing and may hurt. A yellow fluid (colostrum) may leak from your breasts. This is the first milk you're making for your baby. Your hair may grow faster and get thicker. In some cases, you may get hair loss. Your belly button may stick out. You may have more swelling in your hands, face, or ankles. Health changes You may have heartburn. You may feel short of breath. This is caused by the uterus that is now bigger. You may have more aches in the pelvis, back, or thighs. You may have more tingling or numbness in your hands, arms, and legs. You may pee more often. You may have trouble pooping (constipation) or swollen veins in the butt that can itch or get painful (hemorrhoids). Other changes You may have more problems sleeping. You may notice the baby moving lower in your belly (dropping). You may have more fluid coming from your vagina. Your joints may feel loose, and you may have pain around your pelvic bone. Follow these instructions at home: Medicines Take medicines only as told by your health care provider. Some medicines are not safe during pregnancy. Your provider may change the medicines that you take. Do not take any medicines unless told to by your provider. Take a prenatal vitamin that has at least 600 micrograms (mcg) of folic acid. Do not use herbal medicines, illegal drugs, or medicines that are not approved by your provider. Eating and drinking While you're pregnant your body needs additional nutrition to help  support your growing baby. Talk with your provider about your nutritional needs. Activity Most women are able to exercise regularly during pregnancy. Exercise routines may need to change at the end of your pregnancy. Talk to your provider about your activities and exercise routine. Relieving pain and discomfort Rest often with your legs raised if you have leg cramps or low back pain. Take warm sitz baths to soothe pain from hemorrhoids. Use hemorrhoid cream if your provider says it's okay. Wear a good, supportive bra if your breasts hurt. Do not use hot tubs, steam rooms, or saunas. Do not douche. Do not use tampons or scented pads. Safety Talk to your provider before traveling far distances. Wear your seatbelt at all times when you're in a car. Talk to your provider if someone hits you, hurts you, or yells at you. Preparing for birth To prepare for your baby: Take childbirth and breastfeeding classes. Visit the hospital and tour the maternity area. Buy a rear-facing car seat. Learn how to install it in your car. General instructions Avoid cat litter boxes and soil used by cats. These things carry germs that can cause harm to your pregnancy and your baby. Do not drink alcohol, smoke, vape, or use products with nicotine or tobacco in them. If you need help quitting, talk with your provider. Keep all follow-up visits for your third trimester. Your provider will do more exams and tests during this trimester. Write down your questions. Take them to your prenatal visits. Your provider also will: Talk with you about  your overall health. Give you advice or refer you to specialists who can help with different needs, including: Mental health and counseling. Foods and healthy eating. Ask for help if you need help with food. Where to find more information American Pregnancy Association: americanpregnancy.org [employer]ians [employer]necologists: acog.org Office on [employer] Health:  [employer] Contact a health care provider if: You have a headache that does not go away when you take medicine. You have any of these problems: You can't eat or drink. You have nausea and vomiting. You have watery poop (diarrhea) for 2 days or more. You have pain when you pee, or your pee smells bad. You have been sick for 2 days or more and aren't getting better. Contact your provider right away if: You have any of these coming from your vagina: Abnormal discharge. Bad-smelling fluid. Bleeding. Your baby is moving less than usual. You have signs of labor: You have any contractions, belly cramping, or have pain in your pelvis or lower back before 37 weeks of pregnancy (preterm labor). You have regular contractions that are less than 5 minutes apart. Your water breaks. You have symptoms of high blood pressure or preeclampsia. These include: A severe, throbbing headache that does not go away. Sudden or extreme swelling of your face, hands, legs, or feet. Vision problems: You see spots. You have blurry vision. Your eyes are sensitive to light. If you can't reach your provider, go to an urgent care or emergency room. Get help right away if: You faint, become confused, or can't think clearly. You have chest pain or trouble breathing. You have any kind of injury, such as from a fall or a car crash. These symptoms may be an emergency. Call 911 right away. Do not wait to see if the symptoms will go away. Do not drive yourself to the hospital. This information is not intended to replace advice given to you by your health care provider. Make sure you discuss any questions you have with your health care provider. Document Revised: 05/21/2023 Document Reviewed: 12/19/2022 Elsevier Patient Education  2024 Elsevier Inc. Group B Streptococcus Infection During Pregnancy Group B Streptococcus (GBS) is a type of bacteria that is often found in healthy people. It is commonly found in the  rectum, vagina, and intestines. In people who are healthy and not pregnant, the bacteria rarely cause serious illness or complications. However, women who test positive for GBS during pregnancy can pass the bacteria to the baby during childbirth. This can cause serious infection in the baby after birth. Women with GBS may also have infections during their pregnancy or soon after childbirth. The infections include urinary tract infections (UTIs) or infections of the uterus. GBS also increases a woman's risk of complications during pregnancy, such as early labor or delivery, miscarriage, or stillbirth. Routine testing for GBS is recommended for all pregnant women. What are the causes? This condition is caused by bacteria called Streptococcus agalactiae. What increases the risk? You may have a higher risk for GBS infection during pregnancy if you had one during a past pregnancy. What are the signs or symptoms? In most cases, GBS infection does not cause symptoms in pregnant women. If symptoms exist, they may include: Labor that starts before the 37th week of pregnancy. A UTI or bladder infection. This may cause a fever, frequent urination, or pain and burning during urination. Fever during labor. There can also be a rapid heartbeat in the mother or baby. Rare but serious symptoms of a GBS  infection in women include: Blood infection (septicemia). This may cause fever, chills, or confusion. Lung infection (pneumonia). This may cause fever, chills, cough, rapid breathing, chest pain, or difficulty breathing. Bone, joint, skin, or soft tissue infection. How is this diagnosed? You may be screened for GBS between week 35 and week 37 of pregnancy. If you have symptoms of preterm labor, you may be screened earlier. This condition is diagnosed based on lab test results from: A swab of fluid from the vagina and rectum. A urine sample. How is this treated? This condition is treated with antibiotic medicine.  Antibiotic medicine may be given: To you when you go into labor, or as soon as your water breaks. The medicines will continue until after you give birth. If you are having a cesarean delivery, you do not need antibiotics unless your water has broken. To your baby, if he or she requires treatment. Your health care provider will check your baby to decide if he or she needs antibiotics to prevent a serious infection. Follow these instructions at home: Take over-the-counter and prescription medicines only as told by your health care provider. Take your antibiotic medicine as told by your health care provider. Do not stop taking the antibiotic even if you start to feel better. Keep all pre-birth (prenatal) visits and follow-up visits as told by your health care provider. This is important. Contact a health care provider if: You have pain or burning when you urinate. You have to urinate more often than usual. You have a fever or chills. You develop a bad-smelling vaginal discharge. Get help right away if: Your water breaks. You go into labor. You have severe pain in your abdomen. You have difficulty breathing. You have chest pain. These symptoms may represent a serious problem that is an emergency. Do not wait to see if the symptoms will go away. Get medical help right away. Call your local emergency services (911 in the [place] Do not drive yourself to the hospital. Summary GBS is a type of bacteria that is common in healthy people. During pregnancy, colonization with GBS can cause serious complications for you or your baby. Your health care provider will screen you between 35 and 37 weeks of pregnancy to determine if you are colonized with GBS. If you are colonized with GBS during pregnancy, your health care provider will recommend antibiotics through an IV during labor. After delivery, your baby will be evaluated for complications related to potential GBS infection and may require antibiotics to  prevent a serious infection. This information is not intended to replace advice given to you by your health care provider. Make sure you discuss any questions you have with your health care provider. Document Revised: 08/04/2022 Document Reviewed: 08/04/2022 Elsevier Patient Education  2024 [employer].

## 2024-01-21 NOTE — Progress Notes (Signed)
    Return Prenatal Note   Subjective   33 y.o. G3P2002 at [redacted]w[redacted]d presents for this follow-up prenatal visit.  Patient feeling well, active baby, some heaviness and shortness of breath with activity Patient reports: Movement: Present Contractions: Not present   Date Fasting Breakfast Lunch Dinner  05/09 91 115 111   05/10  92 130  120  108  05/11 95 120 115 106  05/12 91 114 110  118  05/13 90 120 92 108  05/14 88 126 103 99  05/15 89 116 103  93   05/16 85 131 110 121  05/17 86 129 114 121  05/18 84  106 112  05/19 95 122 122 115  05/20 92 140 101 105  05/21 91 117 126 94  05/22 95 110    Highest /lowest fasting: 84/95 1 hour post prandial Highest/lowest Breakfast: 110/140 Highest lowest Lunch: 92/126 Highest/lowest Dinner: 93/121   Objective   Flow sheet Vitals: Pulse Rate: 74 BP: 118/76 Fundal Height: 35 cm Fetal Heart Rate (bpm): 150 Presentation: Vertex Total weight gain: 26 lb 14.4 oz (12.2 kg)  General Appearance  No acute distress, well appearing, and well nourished Pulmonary   Normal work of breathing Neurologic   Alert and oriented to person, place, and time Psychiatric   Mood and affect within normal limits  Assessment/Plan   Plan  33 y.o. [name] at [redacted]w[redacted]d presents for follow-up OB visit. Reviewed prenatal record including previous visit note.  Supervision of other normal pregnancy, antepartum Reviewed kick counts and preterm labor warning signs. Instructed to call office or come to hospital with persistent headache, vision changes, regular contractions, leaking of fluid, decreased fetal movement or vaginal bleeding.   Diet controlled gestational diabetes mellitus (GDM) in third trimester Continue daily fasting and may cycle through postprandials, growth ultrasound scheduled.      No orders of the defined types were placed in this encounter.  Return in 2 weeks (on 02/04/2024) for ROB.   Future Appointments  Date Time Provider Department  Center  02/03/2024 11:00 AM [facility]  [facility]  [facility]  02/05/2024  8:35 AM [name], MD [facility] None    For next visit:  continue with routine prenatal care     Forestine Igo, CNM  01/20/2509:49 AM

## 2024-01-23 NOTE — Assessment & Plan Note (Signed)
 Continue daily fasting and may cycle through postprandials, growth ultrasound scheduled.

## 2024-02-03 ENCOUNTER — Other Ambulatory Visit (HOSPITAL_COMMUNITY)
Admission: RE | Admit: 2024-02-03 | Discharge: 2024-02-03 | Disposition: A | Discharge status: Home / Self Care | Source: Ambulatory Visit | Attending: Obstetrics | Admitting: Obstetrics

## 2024-02-03 ENCOUNTER — Ambulatory Visit: Admission: RE | Admit: 2024-02-03 | Source: Ambulatory Visit

## 2024-02-03 ENCOUNTER — Ambulatory Visit (INDEPENDENT_AMBULATORY_CARE_PROVIDER_SITE_OTHER): Admitting: Obstetrics

## 2024-02-03 ENCOUNTER — Ambulatory Visit

## 2024-02-03 VITALS — BP 118/53 | HR 77 | Wt 205.0 lb

## 2024-02-03 DIAGNOSIS — Z3A36 36 weeks gestation of pregnancy: Secondary | ICD-10-CM

## 2024-02-03 DIAGNOSIS — O24410 Gestational diabetes mellitus in pregnancy, diet controlled: Secondary | ICD-10-CM | POA: Diagnosis not present

## 2024-02-03 DIAGNOSIS — O3660X0 Maternal care for excessive fetal growth, unspecified trimester, not applicable or unspecified: Secondary | ICD-10-CM

## 2024-02-03 DIAGNOSIS — Z3480 Encounter for supervision of other normal pregnancy, unspecified trimester: Secondary | ICD-10-CM

## 2024-02-03 DIAGNOSIS — Z113 Encounter for screening for infections with a predominantly sexual mode of transmission: Secondary | ICD-10-CM

## 2024-02-03 DIAGNOSIS — Z3685 Encounter for antenatal screening for Streptococcus B: Secondary | ICD-10-CM

## 2024-02-03 NOTE — Progress Notes (Unsigned)
    Return Prenatal Note   Subjective  33 y.o. [id number] at [redacted]w[redacted]d presents for this follow-up prenatal visit. Pregnancy notable for A1GDM, hx of macrosomia, hx of preeclampsia in prior 2 pregnancies, and rubella non-immune.   Patient states she feels the baby move but not as much at night when she wakes up. Requesting cervical check. Had growth US  earlier today in clinic.   GDM: Checks 1hPP levels.   Date Fasting Breakfast Lunch Dinner  5/25 96 136 88 133  5/26 80 141 88 114  5/27 101 108 105 118  5/28 86 134 111 121  5/29 93 121 103 113  5/30 81 133  104  5/31 87 107 120 120   Date Fasting Breakfast Lunch Dinner  6/1 81 125 130 110  6/2 83 113 128 125  6/3 86 124 125 116   Patient reports: Movement: Present Contractions: Irritability Denies vaginal bleeding or leaking fluid. Objective  Flow sheet Vitals: Pulse Rate: 77 BP: (!) 118/53 Fundal Height: 36 cm Fetal Heart Rate (bpm): 142 Presentation: Vertex Dilation: 2 Effacement (%): 30 Station: -2 Total weight gain: 27 lb (12.2 kg)  General Appearance  No acute distress, well appearing, and well nourished Pulmonary   Normal work of breathing Neurologic   Alert and oriented to person, place, and time Psychiatric   Mood and affect within normal limits   Assessment/Plan   Plan  33 y.o. G3P2002 at [redacted]w[redacted]d by LMP=10wk US  presents for follow-up OB visit. Reviewed prenatal record including previous visit note.  1. Supervision of other normal pregnancy, antepartum (Primary) -GBS and GCCT swabs done today -PTL precautions reviewed; FKC for decreased movement -Continue ASA  2. Diet controlled gestational diabetes mellitus (GDM) in third trimester -Diet-controlled, BG log excellent, continue checks -Growth US  today in clinic completed, awaiting report -Discussed IOL and pt req 6/22 (39.0), scheduled  3. Excessive fetal growth affecting management of pregnancy, antepartum, single or unspecified fetus -Awaiting growth US   report from today, was 98%ile at 32wks -If still LGA, and or develops polyhydramnios, can consider IOL at 38wks  Return in about 1 week (around 02/10/2024) for ROB w/BPP.    Future Appointments  Date Time Provider Department Center  02/10/2024  2:30 PM [facility]  4 [facility]  [facility]  02/10/2024  3:55 PM [name], MD [facility] None    For next visit:  continue with routine prenatal care   [name], DO [facility]

## 2024-02-05 ENCOUNTER — Encounter: Payer: Self-pay | Admitting: Obstetrics

## 2024-02-05 ENCOUNTER — Encounter: Admitting: Obstetrics

## 2024-02-05 LAB — CERVICOVAGINAL ANCILLARY ONLY
Chlamydia: NEGATIVE
Comment: NEGATIVE
Comment: NORMAL
Neisseria Gonorrhea: NEGATIVE

## 2024-02-07 LAB — CULTURE, BETA STREP (GROUP B ONLY): Strep Gp B Culture: NEGATIVE

## 2024-02-10 ENCOUNTER — Ambulatory Visit
Admission: RE | Admit: 2024-02-10 | Discharge: 2024-02-10 | Disposition: A | Discharge status: Home / Self Care | Source: Ambulatory Visit | Attending: Obstetrics

## 2024-02-10 ENCOUNTER — Inpatient Hospital Stay
Admission: EM | Admit: 2024-02-10 | Discharge: 2024-02-12 | DRG: 807 | Disposition: A | Discharge status: Home / Self Care

## 2024-02-10 ENCOUNTER — Ambulatory Visit (INDEPENDENT_AMBULATORY_CARE_PROVIDER_SITE_OTHER): Admitting: Obstetrics

## 2024-02-10 ENCOUNTER — Other Ambulatory Visit: Payer: Self-pay

## 2024-02-10 ENCOUNTER — Encounter: Payer: Self-pay | Admitting: Obstetrics and Gynecology

## 2024-02-10 VITALS — BP 116/72 | HR 80 | Wt 206.0 lb

## 2024-02-10 DIAGNOSIS — O24420 Gestational diabetes mellitus in childbirth, diet controlled: Principal | ICD-10-CM | POA: Diagnosis present

## 2024-02-10 DIAGNOSIS — O368330 Maternal care for abnormalities of the fetal heart rate or rhythm, third trimester, not applicable or unspecified: Secondary | ICD-10-CM | POA: Diagnosis not present

## 2024-02-10 DIAGNOSIS — Z3A37 37 weeks gestation of pregnancy: Secondary | ICD-10-CM

## 2024-02-10 DIAGNOSIS — Z3480 Encounter for supervision of other normal pregnancy, unspecified trimester: Principal | ICD-10-CM

## 2024-02-10 DIAGNOSIS — O283 Abnormal ultrasonic finding on antenatal screening of mother: Secondary | ICD-10-CM | POA: Diagnosis present

## 2024-02-10 DIAGNOSIS — O09293 Supervision of pregnancy with other poor reproductive or obstetric history, third trimester: Secondary | ICD-10-CM | POA: Diagnosis not present

## 2024-02-10 DIAGNOSIS — O3663X0 Maternal care for excessive fetal growth, third trimester, not applicable or unspecified: Secondary | ICD-10-CM | POA: Diagnosis not present

## 2024-02-10 DIAGNOSIS — O288 Other abnormal findings on antenatal screening of mother: Secondary | ICD-10-CM | POA: Diagnosis present

## 2024-02-10 DIAGNOSIS — O24410 Gestational diabetes mellitus in pregnancy, diet controlled: Secondary | ICD-10-CM

## 2024-02-10 DIAGNOSIS — O24419 Gestational diabetes mellitus in pregnancy, unspecified control: Secondary | ICD-10-CM | POA: Diagnosis not present

## 2024-02-10 DIAGNOSIS — Z3A00 Weeks of gestation of pregnancy not specified: Secondary | ICD-10-CM | POA: Diagnosis not present

## 2024-02-10 DIAGNOSIS — Z3A39 39 weeks gestation of pregnancy: Secondary | ICD-10-CM | POA: Diagnosis not present

## 2024-02-10 DIAGNOSIS — O3660X0 Maternal care for excessive fetal growth, unspecified trimester, not applicable or unspecified: Secondary | ICD-10-CM | POA: Diagnosis present

## 2024-02-10 DIAGNOSIS — Z7982 Long term (current) use of aspirin: Secondary | ICD-10-CM

## 2024-02-10 DIAGNOSIS — Z8249 Family history of ischemic heart disease and other diseases of the circulatory system: Secondary | ICD-10-CM | POA: Diagnosis not present

## 2024-02-10 DIAGNOSIS — Z23 Encounter for immunization: Secondary | ICD-10-CM | POA: Diagnosis not present

## 2024-02-10 DIAGNOSIS — Z0542 Observation and evaluation of newborn for suspected metabolic condition ruled out: Secondary | ICD-10-CM | POA: Diagnosis not present

## 2024-02-10 LAB — GLUCOSE, CAPILLARY: Glucose-Capillary: 88 mg/dL (ref 70–99)

## 2024-02-10 LAB — CBC
HCT: 34.2 % — ABNORMAL LOW (ref 36.0–46.0)
Hemoglobin: 11.5 g/dL — ABNORMAL LOW (ref 12.0–15.0)
MCH: 32.9 pg (ref 26.0–34.0)
MCHC: 33.6 g/dL (ref 30.0–36.0)
MCV: 97.7 fL (ref 80.0–100.0)
Platelets: 214 10*3/uL (ref 150–400)
RBC: 3.5 MIL/uL — ABNORMAL LOW (ref 3.87–5.11)
RDW: 13.7 % (ref 11.5–15.5)
WBC: 9.8 10*3/uL (ref 4.0–10.5)
nRBC: 0 % (ref 0.0–0.2)

## 2024-02-10 LAB — TYPE AND SCREEN
ABO/RH(D): O POS
Antibody Screen: NEGATIVE

## 2024-02-10 MED ORDER — OXYTOCIN BOLUS FROM INFUSION: 333.0000 mL | Freq: Once | INTRAVENOUS | Status: DC

## 2024-02-10 MED ORDER — MISOPROSTOL 25 MCG QUARTER TABLET: 25.0000 ug | ORAL_TABLET | Freq: Once | ORAL | Status: DC

## 2024-02-10 MED ORDER — SOD CITRATE-CITRIC ACID 500-334 MG/5ML PO SOLN: 30.0000 mL | ORAL | Status: DC | PRN

## 2024-02-10 MED ORDER — CALCIUM CARBONATE ANTACID 500 MG PO CHEW: 2.0000 | CHEWABLE_TABLET | Freq: Three times a day (TID) | ORAL | Status: DC | PRN

## 2024-02-10 MED ORDER — ACETAMINOPHEN 500 MG PO TABS: 1000.0000 mg | ORAL_TABLET | Freq: Four times a day (QID) | ORAL | Status: DC | PRN

## 2024-02-10 MED ORDER — TERBUTALINE SULFATE 1 MG/ML IJ SOLN: 0.2500 mg | Freq: Once | INTRAMUSCULAR | Status: DC | PRN

## 2024-02-10 MED ORDER — ONDANSETRON HCL 4 MG/2ML IJ SOLN: 4.0000 mg | Freq: Four times a day (QID) | INTRAMUSCULAR | Status: DC | PRN

## 2024-02-10 MED ORDER — LIDOCAINE HCL (PF) 1 % IJ SOLN: 30.0000 mL | INTRAMUSCULAR | Status: DC | PRN

## 2024-02-10 MED ORDER — OXYTOCIN-SODIUM CHLORIDE 30-0.9 UT/500ML-% IV SOLN
2.5000 [IU]/h | INTRAVENOUS | Status: DC
  Filled 2024-02-10: qty 500

## 2024-02-10 MED ORDER — OXYTOCIN 10 UNIT/ML IJ SOLN
INTRAMUSCULAR | Status: AC
  Filled 2024-02-10: qty 2

## 2024-02-10 MED ORDER — FENTANYL CITRATE (PF) 100 MCG/2ML IJ SOLN: 50.0000 ug | INTRAMUSCULAR | Status: DC | PRN

## 2024-02-10 MED ORDER — LIDOCAINE HCL (PF) 1 % IJ SOLN
INTRAMUSCULAR | Status: AC
  Filled 2024-02-10: qty 30

## 2024-02-10 MED ORDER — LACTATED RINGERS IV SOLN: INTRAVENOUS | Status: DC

## 2024-02-10 MED ORDER — OXYTOCIN-SODIUM CHLORIDE 30-0.9 UT/500ML-% IV SOLN
1.0000 m[IU]/min | INTRAVENOUS | Status: DC
  Administered 2024-02-10: 2 m[IU]/min via INTRAVENOUS

## 2024-02-10 MED ORDER — MISOPROSTOL 50MCG HALF TABLET: 50.0000 ug | ORAL_TABLET | ORAL | Status: DC | PRN

## 2024-02-10 MED ORDER — AMMONIA AROMATIC IN INHA
RESPIRATORY_TRACT | Status: AC
  Filled 2024-02-10: qty 10

## 2024-02-10 MED ORDER — HYDROXYZINE HCL 25 MG PO TABS: 50.0000 mg | ORAL_TABLET | Freq: Four times a day (QID) | ORAL | Status: DC | PRN

## 2024-02-10 MED ORDER — LACTATED RINGERS IV SOLN: 500.0000 mL | INTRAVENOUS | Status: DC | PRN

## 2024-02-10 MED ORDER — MISOPROSTOL 200 MCG PO TABS
ORAL_TABLET | ORAL | Status: AC
  Filled 2024-02-10: qty 4

## 2024-02-10 NOTE — H&P (Signed)
 [facility] Labor & Delivery  History and Physical  ASSESSMENT AND PLAN   Candace Higgins is a 33 y.o. G3P2002 at [redacted]w[redacted]d with EDD: 02/28/2024, by Last Menstrual Period confirmed by 10 wk US , admitted for induction due to [facility] 4/8 in the setting of A1GDM.  Induction - In clinic today, patient had a BPP 4/8. After consultation with MFM, induction was recommended.  - Induction started with placement of Cook catheter and pitocin . Patient placed in frog-leg position, cervix located digitally, Cook catheter inserted, uterine and vaginal balloons inflated to 80 mL each. Patient tolerated procedure well. Will consider AROM as appropriate for augmentation after Cook catheter is expelled.  - Patient aware of pain management options. Has had an epidural with both previous deliveries, anticipates with this one as well. - Anticipate SVD.   Other Active Problems:  A1GDM - Good glycemic control with diet during pregnancy. - Will monitor CBG per protocol in labor.   Hx of Macrosomia - With G2, birth weight of 4060g, per note and patient report, no dystocia with SVD.  - Current pregnancy, baby measured in the 98%tile on 5/8 and then in the 41%tile on 6/4.   Fetal Status: - cephalic presentation by US , Leopold's, and SVE - EFW: 8 lb by Leopold's  - Continuous FHR monitoring - FHT currently cat I  Labs/Immunizations: Prenatal labs and studies: ABO, Rh: --/--/O POS (06/11 1823) Antibody: NEG (06/11 1823) Rubella: <0.90 (12/11 1039) Varicella: Reactive (12/11 1039)  RPR: Non Reactive (04/10 0910)  HBsAg: Negative (12/11 1039)  HepC: Non Reactive (12/11 1039) HIV: Non Reactive (04/10 0910)  GBS: Negative/-- (06/04 1554)   TDAP: given prenatally Flu: no  Postpartum Plan: - Feeding: Breast Milk - Contraception: plans Combined OCPs - Prenatal Care Provider: [facility]     HPI   Chief Complaint: Induction of labor  Candace Higgins is a 33 y.o. G3P2002 at [redacted]w[redacted]d who presents for  induction of labor.    Patient was sent from clinic for induction after a BPP of 4/8. Denies contractions, vaginal bleeding, and LOF.   Pregnancy Complications Patient Active Problem List   Diagnosis Date Noted   AFI (amniotic fluid index) borderline low 02/10/2024   Gestational diabetes mellitus (GDM), antepartum 02/10/2024   Abnormal ultrasonic finding on antenatal screening of mother 02/10/2024   Diet controlled gestational diabetes mellitus (GDM) in third trimester 12/22/2023   Excessive fetal growth affecting management of mother, antepartum 12/10/2023   History of pre-eclampsia 08/12/2023   Supervision of other normal pregnancy, antepartum 07/17/2023    Review of Systems A twelve point review of systems was negative except as stated in HPI.   HISTORY   Medications Medications Prior to Admission  Medication Sig Dispense Refill Last Dose/Taking   Accu-Chek Softclix Lancets lancets daily.   02/10/2024   aspirin  EC 81 MG tablet Take 2 tablets (162 mg total) by mouth daily. Swallow whole. Start at [redacted] weeks pregnant 60 tablet 12 02/09/2024   Blood Glucose Monitoring Suppl (ACCU-CHEK GUIDE ME) w/Device KIT 4 (four) times daily.   02/10/2024   Glucose Blood (BLOOD GLUCOSE TEST STRIPS) STRP 1 each by In Vitro route 4 (four) times daily. May substitute to any manufacturer covered by patient's insurance. To check blood glucose four times daily, fasting & 2h postprandial. 200 strip 3 02/10/2024   Prenatal Vit-Fe Fumarate-FA (MULTIVITAMIN-PRENATAL) 27-0.8 MG TABS tablet Take 1 tablet by mouth daily at 12 noon.   02/09/2024    Allergies has no known allergies.  OB History OB History  Gravida Para Term Preterm AB Living  3 2 2  0 0 2  SAB IAB Ectopic Multiple Live Births  0 0 0 0 2    # Outcome Date GA Lbr Len/2nd Weight Sex Type Anes PTL Lv  3 Current           2 Term 01/28/18 [redacted]w[redacted]d / 00:49 4060 g F Vag-Spont EPI  LIV     Name: Feng,GIRL Emmalin     Apgar1: 8  Apgar5: 9  1 Term  09/01/14 [redacted]w[redacted]d  2920 g M Vag-Spont   LIV     Complications: Mild preeclampsia    Obstetric Comments  First menstrual period age 57    Past Medical History Past Medical History:  Diagnosis Date   Anxiety    Benign neoplasm of breast 2013   Cervical ectropion 01/17/2021   Mild preeclampsia 2016   G!    Past Surgical History Past Surgical History:  Procedure Laterality Date   BREAST BIOPSY  09/02/2011   left   nail biopsy Left 03/14/2022   2nd toe   WISDOM TOOTH EXTRACTION  09/02/2007    Social History  reports that she has never smoked. She has never used smokeless tobacco. She reports that she does not currently use alcohol. She reports that she does not use drugs.   Family History family history includes Colon cancer (age of onset: 22) in her father; Healthy in her brother, maternal grandfather, maternal grandmother, mother, paternal grandmother, sister, and sister; Hypertension in her father and paternal grandfather; Lung cancer in her paternal grandfather.   PHYSICAL EXAM   Vitals:   02/10/24 1807 02/10/24 1817 02/10/24 1833  BP: 138/71  138/71  Pulse: 79  87  Resp:   18  Temp:   97.8 F (36.6 C)  TempSrc:   Oral  Weight:  93.4 kg   Height:  5' 6 (1.676 m)     Constitutional: No acute distress, well appearing, and well nourished. Neurologic: She is alert and conversational.  Psychiatric: She has a normal mood and affect.  Musculoskeletal: Normal gait, grossly normal range of motion Cardiovascular: Normal rate.   Pulmonary/Chest: Normal work of breathing.  Gastrointestinal/Abdominal: Soft. Gravid. There is no tenderness.  Skin: Skin is warm and dry. No rash noted.  Genitourinary: Normal external female genitalia.  SVE:   Dilation: 2.5 Effacement (%): 50 Station: -2 Presentation: Vertex Exam by:: Kieren Ricci CNM SSE: deferred  NST Interpretation Indication: Induction Baseline: 140 bpm Variability: moderate Accelerations: present Decelerations:  absent Contractions: irregular Time noted:  See OBIX Impression: reactive Authenticated by: Verita Glassman Annelle Behrendt   Attending Dr. Madelene Schanz was immediately available for the care of the patient.   Verita Glassman Gawain Crombie, CNM

## 2024-02-10 NOTE — Progress Notes (Addendum)
    Return Prenatal Note   Subjective  33 y.o. [name] at [redacted]w[redacted]d presents for this follow-up prenatal visit. Pregnancy notable for A1GDM, hx of macrosomia, hx of preeclampsia in prior 2 pregnancies, and rubella non-immune.    Patient reports vaginal pressure today, requesting cervical exam.   A1GDM:  Recent growth US  at 36wks showed EFW of 41%, AC 43%, MVP 5.3. This was markedly down from prior 32wk growth showing 98%, AC 99%. Had BPP earlier today for GDM with LGA at the hospital, 4/8, off for breathing and AFI of 5.5.   BG Log: checks 1hPP  Date Fasting Breakfast Lunch Dinner  6/6 93 125 112 126  6/7 92 120 135 136  6/8 90 120 130 136  6/9 92 135  114 120  6/10 86 126  113  6/11 82 104     Patient reports: Movement: Present Contractions: Regular Denies vaginal bleeding or leaking fluid. Objective  Flow sheet Vitals: Pulse Rate: 80 BP: 116/72 Total weight gain: 28 lb (12.7 kg)  General Appearance  No acute distress, well appearing, and well nourished Pulmonary   Normal work of breathing Neurologic   Alert and oriented to person, place, and time Psychiatric   Mood and affect within normal limits   Assessment/Plan   Plan  33 y.o. G3P2002 at 104w3d by LMP=10wk US  presents for follow-up OB visit. Reviewed prenatal record including previous visit note.  1. Supervision of other normal pregnancy, antepartum (Primary) -GBS negative -Deferring cervical exam to CNM once pt on L&D  2. Diet controlled gestational diabetes mellitus (GDM) in third trimester -Sugars are well-controlled with diet only -BPP today 4/8 for breathing and AFI 5.5. Initially sending patient to OBT for extended fetal monitoring, but discussed with MFM Candace Higgins and he agrees with IOL for nonreassuring fetal testing.   3. Excessive fetal growth affecting management of pregnancy, antepartum, single or unspecified fetus -32wk EFW 98%, AC 99% and pt with hx of macrosomia -36wk EFW 41%ile, may have been some  technical difficulties with new US  tech and new machine  4. Abnormal findings on antenatal screening of fetus -As above, BPP 4/8, sending to L&D for IOL   Future Appointments  Date Time Provider Department Center  02/17/2024  3:35 PM Higgins, Candace Distel, CNM [facility] None   For next visit:  to L&D for IOL; CNM Candace Higgins notified and aware of her pending arrival   Candace Dunn, DO [facility] [employer]

## 2024-02-10 NOTE — Progress Notes (Signed)
 Labor Progress Note   ASSESSMENT/PLAN   Candace Higgins 33 y.o.   [name]  at [redacted]w[redacted]d here for induction due to [facility] 4/8.  FWB:  - Fetal well being assessed: Cat I      GBS: - GBS Negative/-- (06/04 1554)    LABOR: - Now with cervical ripening, awaiting active labor, doing well. - Pain Management: position changes and breathing - Cook catheter expelled with gentle traction. Discussed options with patient and performed AROM to clear fluid. Will continue to titrate pitocin  per protocol.  - Anticipate SVD   Active Problems: A1GDM - BG 88 on admission  Labor Progress: 1930 2.5/50/-2, Cook catheter placed, pit started 2300 4/80/-2, Cook catheter out, AROM (clr)  SUBJECTIVE/OBJECTIVE   SUBJECTIVE:  Patient doing well, feeling contractions more strongly.    OBJECTIVE: Vital Signs: Patient Vitals for the past 12 hrs:  BP Temp Temp src Pulse Resp Height Weight  02/10/24 1905 118/71 98 F (36.7 C) Oral 86 18 -- --  02/10/24 1833 138/71 97.8 F (36.6 C) Oral 87 18 -- --  02/10/24 1817 -- -- -- -- -- 5' 6 (1.676 m) 93.4 kg  02/10/24 1807 138/71 -- -- 79 -- -- --    Last SVE:  Dilation: 4 Effacement (%): 80 Station: -2 Presentation: Vertex Exam by:: S Michiel Sivley CNM -  , Rupture Date: 02/10/24, Rupture Time: 2305,    FHR:   - Mode: External  - Baseline Rate (A): 145 bpm  -    - Characteristics (ie - accels, decels): Accelerations: 15 x 15  -    UTERINE ACTIVITY:   - Mode: Toco  - Contraction Frequency (min): 1-2 minutes Pitocin : 8 mU/min

## 2024-02-11 ENCOUNTER — Inpatient Hospital Stay: Admitting: Anesthesiology

## 2024-02-11 DIAGNOSIS — O283 Abnormal ultrasonic finding on antenatal screening of mother: Secondary | ICD-10-CM

## 2024-02-11 DIAGNOSIS — O09293 Supervision of pregnancy with other poor reproductive or obstetric history, third trimester: Secondary | ICD-10-CM

## 2024-02-11 DIAGNOSIS — O3663X0 Maternal care for excessive fetal growth, third trimester, not applicable or unspecified: Secondary | ICD-10-CM

## 2024-02-11 DIAGNOSIS — O24420 Gestational diabetes mellitus in childbirth, diet controlled: Principal | ICD-10-CM

## 2024-02-11 LAB — GLUCOSE, CAPILLARY: Glucose-Capillary: 142 mg/dL — ABNORMAL HIGH (ref 70–99)

## 2024-02-11 LAB — RPR: RPR Ser Ql: NONREACTIVE

## 2024-02-11 LAB — GLUCOSE, RANDOM: Glucose, Bld: 124 mg/dL — ABNORMAL HIGH (ref 70–99)

## 2024-02-11 MED ORDER — SIMETHICONE 80 MG PO CHEW: 80.0000 mg | CHEWABLE_TABLET | ORAL | Status: DC | PRN

## 2024-02-11 MED ORDER — FENTANYL-BUPIVACAINE-NACL 0.5-0.125-0.9 MG/250ML-% EP SOLN
EPIDURAL | Status: DC | PRN
  Administered 2024-02-11: 12 mL/h via EPIDURAL

## 2024-02-11 MED ORDER — MEASLES, MUMPS & RUBELLA VAC IJ SOLR
0.5000 mL | Freq: Once | INTRAMUSCULAR | Status: DC
  Filled 2024-02-11: qty 0.5

## 2024-02-11 MED ORDER — DIPHENHYDRAMINE HCL 25 MG PO CAPS: 25.0000 mg | ORAL_CAPSULE | Freq: Four times a day (QID) | ORAL | Status: DC | PRN

## 2024-02-11 MED ORDER — ACETAMINOPHEN 325 MG PO TABS: 650.0000 mg | ORAL_TABLET | ORAL | Status: DC | PRN

## 2024-02-11 MED ORDER — WITCH HAZEL-GLYCERIN EX PADS
MEDICATED_PAD | CUTANEOUS | Status: DC | PRN
  Filled 2024-02-11: qty 100

## 2024-02-11 MED ORDER — PRENATAL MULTIVITAMIN CH
1.0000 | ORAL_TABLET | Freq: Every day | ORAL | Status: DC
  Administered 2024-02-11: 1 via ORAL
  Filled 2024-02-11 (×2): qty 1

## 2024-02-11 MED ORDER — COCONUT OIL OIL: 1.0000 | TOPICAL_OIL | Status: DC | PRN

## 2024-02-11 MED ORDER — OXYTOCIN-SODIUM CHLORIDE 30-0.9 UT/500ML-% IV SOLN: 2.5000 [IU]/h | INTRAVENOUS | Status: DC | PRN

## 2024-02-11 MED ORDER — LIDOCAINE HCL (PF) 1 % IJ SOLN
INTRAMUSCULAR | Status: DC | PRN
  Administered 2024-02-11: 3 mL via SUBCUTANEOUS

## 2024-02-11 MED ORDER — SODIUM CHLORIDE 0.9 % IV SOLN
INTRAVENOUS | Status: DC | PRN
  Administered 2024-02-11: 5 mL via EPIDURAL
  Administered 2024-02-11: 3 mL via EPIDURAL

## 2024-02-11 MED ORDER — LIDOCAINE-EPINEPHRINE (PF) 1.5 %-1:200000 IJ SOLN
INTRAMUSCULAR | Status: DC | PRN
  Administered 2024-02-11: 3 mL via EPIDURAL

## 2024-02-11 MED ORDER — DOCUSATE SODIUM 100 MG PO CAPS
100.0000 mg | ORAL_CAPSULE | Freq: Two times a day (BID) | ORAL | Status: DC
Start: 2024-02-11 — End: 2024-02-12
  Filled 2024-02-11: qty 1

## 2024-02-11 MED ORDER — IBUPROFEN 600 MG PO TABS
600.0000 mg | ORAL_TABLET | Freq: Four times a day (QID) | ORAL | Status: DC
Start: 2024-02-11 — End: 2024-02-12
  Administered 2024-02-11 – 2024-02-12 (×4): 600 mg via ORAL
  Filled 2024-02-11 (×4): qty 1

## 2024-02-11 MED ORDER — FENTANYL-BUPIVACAINE-NACL 0.5-0.125-0.9 MG/250ML-% EP SOLN
EPIDURAL | Status: AC
  Filled 2024-02-11: qty 250

## 2024-02-11 MED ORDER — BENZOCAINE-MENTHOL 20-0.5 % EX AERO
1.0000 | INHALATION_SPRAY | CUTANEOUS | Status: DC | PRN
  Filled 2024-02-11: qty 56

## 2024-02-11 NOTE — Discharge Summary (Signed)
 Postpartum Discharge Summary  Date of Service updated 02/12/2024     Patient Name: Candace Higgins DOB: 29-Oct-1990 MRN: [id number]  Date of admission: 02/10/2024 Delivery date:02/11/2024 Delivering provider: FREE, Verita Glassman Date of discharge: 02/12/2024  Admitting diagnosis: Gestational diabetes mellitus (GDM), antepartum [O24.419] Intrauterine pregnancy: [redacted]w[redacted]d     Secondary diagnosis:  Principal Problem:   Abnormal ultrasonic finding on antenatal screening of mother Active Problems:   Supervision of other normal pregnancy, antepartum   Excessive fetal growth affecting management of mother, antepartum   AFI (amniotic fluid index) borderline low   Gestational diabetes mellitus (GDM), antepartum    Discharge diagnosis: Term Pregnancy Delivered and GDM A1                                              Post partum procedures:none Augmentation: AROM, Pitocin , and IP Foley Complications: None  Hospital course: Induction of Labor With Vaginal Delivery   33 y.o. yo G3P2002 at [redacted]w[redacted]d was admitted to the hospital 02/10/2024 for induction of labor.  Indication for induction: BPP 4/8 with A1GDM at term.  Patient had an uncomplicated labor course. Membrane Rupture Time/Date: 11:05 PM,02/10/2024  Delivery Method:Vaginal, Spontaneous Operative Delivery:N/A Episiotomy: None Lacerations:  None Details of delivery can be found in separate delivery note.  Patient had a postpartum course uncomplicated. Patient is discharged home 02/12/24.  Newborn Data: Birth date:02/11/2024 Birth time:2:04 AM Gender:Female Living status:Living Apgars:9 ,9  Weight:3420 g  Magnesium Sulfate received: No BMZ received: No Rhophylac:N/A ION:GEXBMWU T-DaP:Given prenatally Flu: No RSV Vaccine received: No Transfusion:No Immunizations administered: Immunization History  Administered Date(s) Administered   Tdap 11/27/2017, 12/10/2023    Physical exam  Vitals:   02/11/24 0815 02/11/24 1157 02/11/24 1519  02/11/24 2352  BP: (!) 132/58 111/62 109/69 106/65  Pulse: 72 87 75 73  Resp: 18 18 18 18   Temp: 98 F (36.7 C) 97.7 F (36.5 C) 98.1 F (36.7 C) 98.5 F (36.9 C)  TempSrc: Oral Oral Oral Oral  SpO2: 99% 97% 100% 100%  Weight:      Height:       General: alert, cooperative, and no distress Lochia: appropriate Uterine Fundus: firm Incision: N/A DVT Evaluation: No evidence of DVT seen on physical exam. Negative Homan's sign. No cords or calf tenderness. No significant calf/ankle edema. Labs: Lab Results  Component Value Date   WBC 9.8 02/10/2024   HGB 11.5 (L) 02/10/2024   HCT 34.2 (L) 02/10/2024   MCV 97.7 02/10/2024   PLT 214 02/10/2024      Latest Ref Rng & Units 02/11/2024    5:56 AM  CMP  Glucose 70 - 99 mg/dL 132    Edinburgh Score:    07/17/2023   10:32 AM  Edinburgh Postnatal Depression Scale Screening Tool  I have been able to laugh and see the funny side of things. 0  I have looked forward with enjoyment to things. 0  I have blamed myself unnecessarily when things went wrong. 0  I have been anxious or worried for no good reason. 0  I have felt scared or panicky for no good reason. 0  Things have been getting on top of me. 1  I have been so unhappy that I have had difficulty sleeping. 0  I have felt sad or miserable. 0  I have been so unhappy that I have been  crying. 0  The thought of harming myself has occurred to me. 0  Edinburgh Postnatal Depression Scale Total 1      Data saved with a previous flowsheet row definition      After visit meds:  Allergies as of 02/12/2024   No Known Allergies      Medication List     STOP taking these medications    Accu-Chek Guide Me w/Device Kit   Accu-Chek Softclix Lancets lancets   aspirin  EC 81 MG tablet   BLOOD GLUCOSE TEST STRIPS Strp       TAKE these medications    multivitamin-prenatal 27-0.8 MG Tabs tablet Take 1 tablet by mouth daily at 12 noon.         Discharge home in stable  condition Infant Feeding: Breast Infant Disposition:home with mother Discharge instruction: per After Visit Summary and Postpartum booklet. Activity: Advance as tolerated. Pelvic rest for 6 weeks.  Diet: routine diet Anticipated Birth Control: condoms and natural family planning Postpartum Appointment:6 weeks Additional Postpartum F/U: Postpartum Depression checkup Future Appointments: No future appointments.  Follow up Visit:  Follow-up Information     [facility]. Schedule an appointment as soon as possible for a visit.   Specialty: Obstetrics and Gynecology Why: In two for telehealth mood check and in 6 weeks for in person PP. Contact information: [address] [place]  [phone]                    02/12/2024 Alise Appl, CNM

## 2024-02-11 NOTE — Plan of Care (Signed)
 Pt's pain is managed well. Up to void x2 this shift. Bleeding is small and fundus is firm.

## 2024-02-11 NOTE — Anesthesia Procedure Notes (Signed)
 Epidural Patient location during procedure: OB End time: 02/11/2024 1:07 AM  Staffing Performed: anesthesiologist   Preanesthetic Checklist Completed: patient identified, IV checked, site marked, risks and benefits discussed, surgical consent, monitors and equipment checked, pre-op evaluation and timeout performed  Epidural Patient position: sitting Prep: Betadine Patient monitoring: heart rate, continuous pulse ox and blood pressure Approach: midline Location: L4-L5 Injection technique: LOR saline  Needle:  Needle type: Tuohy  Needle gauge: 17 G Needle length: 9 cm and 9 Needle insertion depth: 7 cm Catheter type: closed end flexible Catheter size: 19 Gauge Catheter at skin depth: 12 cm Test dose: negative and 1.5% lidocaine  with Epi 1:200 K  Assessment Events: blood not aspirated, no cerebrospinal fluid, injection not painful, no injection resistance, no paresthesia and negative IV test  Additional Notes   Patient tolerated the insertion well without complications.Reason for block:procedure for pain

## 2024-02-11 NOTE — Lactation Note (Signed)
 This note was copied from a baby's chart. Lactation Consultation Note  Patient Name: [name] Date: 02/11/2024 Age:34 hours     Maternal Data    Feeding    LATCH Score                    Lactation Tools Discussed/Used    Interventions  RN notified to inform family that [facility] assistance is available this evening and to call PRN. Mother of baby stated that feedings were going well and no assistance or education was needed during interaction with LC during previous shift.  Discharge    Consult Status      Seldon Dago 02/11/2024, 9:20 PM

## 2024-02-11 NOTE — Lactation Note (Signed)
 This note was copied from a baby's chart. Lactation Consultation Note  Patient Name: Girl Deziray Nabi [id number] Date: 02/11/2024 Age:34 hours Reason for consult: Initial assessment   Maternal Data Does the patient have breastfeeding experience prior to this delivery?: Yes Maternal pt awake and alert changing infant diaper upon LC room entry, stated she had just fed infant prior to change. Infant drowsy and uninterested in feeding, maternal pt denies needing any assistance and states she will contact LC via board information if any further assistance is needed.  Feeding Mother's Current Feeding Choice: Breast Milk Per maternal report, breastfeeding is going extremely well with no pain or discomfort and audible signs of transfer evident upon latching infant.    Lactation Tools Discussed/Used  Discussed breast compression, milk maturation, hunger cues, feeding on demand, and signs of engorgement. Pt denied observation and assessment of next feed but will contact LC if anything should arise.   Interventions Interventions: Education (Discussed infant hunger cues, signs of engorgement and milk maturation, and updated contact info for inpatient stay.)  Discharge Discharge Education: Engorgement and breast care;Warning signs for feeding baby;Outpatient recommendation  Consult Status Consult Status: Follow-up Date: 02/12/24 Follow-up type: In-patient    Bronson Canny 02/11/2024, 5:52 PM

## 2024-02-11 NOTE — Progress Notes (Signed)
 Subjective:   MAGDELYN ROEBUCK had a NSVB on 02/11/2024. Her labor was uncomplicated. Has had routine postpartum care.  Pt. Is eating, hydrating, and voiding regularly without difficulty. Has yet to have BM. She is breastfeeding. Reports mild/moderate vaginal bleeding, denies passing large blood clots. Has had cramping abdomen pain relieved with tylenol /ibuprofen . Plans to use condoms for contraception. Denies anxiety/depression symptoms. Endorses good support from partner and family.   Objective:  Vital signs in last 24 hours: Temp:  [97.8 F (36.6 C)-98 F (36.7 C)] 98 F (36.7 C) (06/12 0815) Pulse Rate:  [72-89] 72 (06/12 0815) Resp:  [16-18] 18 (06/12 0815) BP: (108-138)/(58-72) 132/58 (06/12 0815) SpO2:  [99 %-100 %] 99 % (06/12 0815) Weight:  [93.4 kg] 93.4 kg (06/11 1817)    General: NAD Pulmonary: no increased work of breathing Abdomen: soft, non-tender Fundus: firm, midline, at umbilicus Lochia: light rubra, no clots Perineum: no erythema or foul odor discharge, minimal edema, laceration well approximated  Extremities: no edema, no erythema, no tenderness  Results for orders placed or performed during the hospital encounter of 02/10/24 (from the past 72 hours)  CBC     Status: Abnormal   Collection Time: 02/10/24  6:23 PM  Result Value Ref Range   WBC 9.8 4.0 - 10.5 K/uL   RBC 3.50 (L) 3.87 - 5.11 MIL/uL   Hemoglobin 11.5 (L) 12.0 - 15.0 g/dL   HCT 16.1 (L) 09.6 - 04.5 %   MCV 97.7 80.0 - 100.0 fL   MCH 32.9 26.0 - 34.0 pg   MCHC 33.6 30.0 - 36.0 g/dL   RDW 40.9 81.1 - 91.4 %   Platelets 214 150 - 400 K/uL   nRBC 0.0 0.0 - 0.2 %    Comment: Performed at [facility], [address] [place], [place] [address]  Type and screen     Status: None   Collection Time: 02/10/24  6:23 PM  Result Value Ref Range   ABO/RH(D) O POS    Antibody Screen NEG    Sample Expiration      02/13/2024,2359 Performed at [facility], [address]  [place], [place] [address]   Glucose, capillary     Status: None   Collection Time: 02/10/24  9:05 PM  Result Value Ref Range   Glucose-Capillary 88 70 - 99 mg/dL    Comment: Glucose reference range applies only to samples taken after fasting for at least 8 hours.  Fasting glucose     Status: Abnormal   Collection Time: 02/11/24  5:56 AM  Result Value Ref Range   Glucose, Bld 124 (H) 70 - 99 mg/dL    Comment: Glucose reference range applies only to samples taken after fasting for at least 8 hours. Performed at [facility], [address], [place], [place] [address]     Assessment:   33 y.o. [phone] 0 day(s)  s/p NSVB Breastfeeding VSS Pain well controlled  Plan:     Blood Type --/--/O POS (06/11 1823) / Rubella <0.90 (12/11 1039) / Varicella Immune Rhogam not indicated Tdap/varicella/rubella to be offered before discharge if indicated Feeding plan breast, lactation support Encouraged to continue breastfeeding, BF education on latch, position changes, cluster feeding, hunger cues, lactogenesis II, milk supply Education given regarding options for contraception, as well as compatibility with breast feeding if applicable.  Patient plans on condoms for contraception. Continued routine postpartum care  Counseled on normal uterine involution and vaginal bleeding postpartum Anticipate discharge home tomorrow  Forestine Igo, CNM [facility] 02/11/2024, 8:49 AM

## 2024-02-11 NOTE — Anesthesia Preprocedure Evaluation (Addendum)
 Anesthesia Evaluation  Patient identified by MRN, date of birth, ID band Patient awake    Reviewed: Allergy & Precautions, H&P , NPO status , Patient's Chart, lab work & pertinent test results, reviewed documented beta blocker date and time   Airway Mallampati: II  TM Distance: >3 FB Neck ROM: full    Dental no notable dental hx. (+) Teeth Intact   Pulmonary neg pulmonary ROS   Pulmonary exam normal breath sounds clear to auscultation       Cardiovascular Exercise Tolerance: Good hypertension, On Medications negative cardio ROS  Rhythm:regular Rate:Normal     Neuro/Psych negative neurological ROS  negative psych ROS   GI/Hepatic negative GI ROS, Neg liver ROS,,,  Endo/Other  negative endocrine ROSdiabetes, Well Controlled    Renal/GU negative Renal ROS  negative genitourinary   Musculoskeletal   Abdominal   Peds  Hematology negative hematology ROS (+)   Anesthesia Other Findings   Reproductive/Obstetrics (+) Pregnancy                              Anesthesia Physical Anesthesia Plan  ASA: 2  Anesthesia Plan: Epidural   Post-op Pain Management:    Induction:   PONV Risk Score and Plan:   Airway Management Planned:   Additional Equipment:   Intra-op Plan:   Post-operative Plan:   Informed Consent: I have reviewed the patients History and Physical, chart, labs and discussed the procedure including the risks, benefits and alternatives for the proposed anesthesia with the patient or authorized representative who has indicated his/her understanding and acceptance.       Plan Discussed with:   Anesthesia Plan Comments:          Anesthesia Quick Evaluation

## 2024-02-12 NOTE — Anesthesia Postprocedure Evaluation (Signed)
 Anesthesia Post Note  Patient: Candace Higgins  Procedure(s) Performed: AN AD HOC LABOR EPIDURAL  Patient location during evaluation: Mother Baby Anesthesia Type: Epidural Level of consciousness: awake and alert Pain management: pain level controlled Vital Signs Assessment: post-procedure vital signs reviewed and stable Respiratory status: spontaneous breathing, nonlabored ventilation and respiratory function stable Cardiovascular status: stable Postop Assessment: no headache, no backache, epidural receding and able to ambulate Anesthetic complications: no   No notable events documented.   Last Vitals:  Vitals:   02/11/24 1519 02/11/24 2352  BP: 109/69 106/65  Pulse: 75 73  Resp: 18 18  Temp: 36.7 C 36.9 C  SpO2: 100% 100%    Last Pain:  Vitals:   02/12/24 0533  TempSrc:   PainSc: 3                  Nasario Badder

## 2024-02-12 NOTE — Progress Notes (Signed)
 Patient discharged home with family. Discharge instructions, when to follow up, and prescriptions reviewed with patient. Patient verbalized understanding. Patient will be escorted out by auxiliary. Patient refused lactation but RN went over engorgement and other lactation education.

## 2024-02-17 ENCOUNTER — Encounter: Admitting: Certified Nurse Midwife

## 2024-02-18 DIAGNOSIS — Z0289 Encounter for other administrative examinations: Secondary | ICD-10-CM

## 2024-02-23 ENCOUNTER — Telehealth

## 2024-02-23 NOTE — Progress Notes (Signed)
   Virtual Visit via Video Note   I connected withNAME@  on 02/23/24 10:22 AM by a video enabled telemedicine application and verified that I am speaking with the correct person using two identifiers.   Location: Patient: in car Provider: [facility]   I discussed the limitations of evaluation and management by telemedicine and the availability of in person appointments. The patient expressed understanding and agreed to proceed. Subjective:    Subjective  Candace Higgins is a 33 y.o. female who presents for a postpartum visit. She is2 weeks  postpartum following a spontaneous vaginal delivery. I have fully reviewed the prenatal and intrapartum course. The delivery was at [redacted]w[redacted]d gestational age.  Anesthesia: epidural.    Postpartum course has been normal. She has no pain and no other concerns. Baby's course has been normal. Baby is feeding by  breast. Bleeding no bleeding. Bowel function is normal. Bladder function is normal.  .  Contraception method: most likely condoms. Postpartum depression screening: EPDS 9, states she feels good and this baby has been the calmest one of her children. Has good support at home.     Review of Systems Pertinent positives noted in HPI. Remainder of comprehensive ROS otherwise negative.     Objective:      Objective  There were no vitals taken for this visit.  General:  alert, cooperative, and no distress        Assessment:    1. Encounter for screening for maternal depression - Screening today. EPDS of 9, no to question 10. Patient states she feels good mood-wise. Will rescreen at 6 week postpartum visit.     2. Postpartum state - Overall doing well. Continue routine postpartum home care.    3. Lactating mother -  Breastfeeding going well, milk has come in.   4. Contraception - Most likely condoms.    Plan:      Plan    I discussed the assessment and treatment plan with the patient. The patient was provided an opportunity to ask  questions and all were answered. The patient agreed with the plan and demonstrated an understanding of the instructions.   The patient was advised to call back or seek an in-person evaluation if the symptoms worsen or if the condition fails to improve as anticipated.   I provided 10 minutes of non-face-to-face time during this encounter.     Lauraine PARAS Ever Gustafson, CNM

## 2024-03-25 ENCOUNTER — Encounter: Payer: Self-pay | Admitting: Certified Nurse Midwife

## 2024-03-25 ENCOUNTER — Ambulatory Visit: Admitting: Certified Nurse Midwife

## 2024-03-25 DIAGNOSIS — O24419 Gestational diabetes mellitus in pregnancy, unspecified control: Secondary | ICD-10-CM

## 2024-03-25 NOTE — Patient Instructions (Signed)

## 2024-03-25 NOTE — Progress Notes (Signed)
 Subjective:    Candace Higgins is a 33 y.o. G1P2002 Caucasian female who presents for a postpartum visit. She is 6 weeks postpartum following a spontaneous vaginal delivery at 37.4 gestational weeks. Anesthesia: epidural. I have fully reviewed the prenatal and intrapartum course. Postpartum course has been normal. Baby's course has been normal. Baby is feeding by breast. Bleeding no bleeding. Bowel function is normal. Bladder function is normal. Patient is sexually active. Last sexual activity: last week. Contraception method is natural family planning. Postpartum depression screening: negative. Score 10.  Last pap 01/17/2021 and was negative. Mild postpartum depression. Declines medication and counseling at this time. States it comes in spurts and that she has good support  The following portions of the patient's history were reviewed and updated as appropriate: allergies, current medications, past medical history, past surgical history and problem list.  Review of Systems Pertinent items are noted in HPI.   Vitals:   03/25/24 1104  BP: 118/81  Pulse: 75  Weight: 181 lb 1.6 oz (82.1 kg)   Patient's last menstrual period was 05/24/2023 (exact date).  Objective:   General:  alert, cooperative and no distress   Breasts:  deferred, no complaints  Lungs: clear to auscultation bilaterally  Heart:  regular rate and rhythm  Abdomen: soft, nontender   Vulva: normal  Vagina: normal vagina  Cervix:  closed  Corpus: Well-involuted  Adnexa:  Non-palpable  Rectal Exam: no hemorrhoids        Assessment:   Postpartum exam 6 wks s/p SVD Breastfeeding Depression screening Contraception counseling   Plan:  : condoms and NFP Follow up in: 2 months for labs or earlier if needed  Zelda Hummer, CNM

## 2024-05-26 ENCOUNTER — Other Ambulatory Visit

## 2024-05-26 ENCOUNTER — Telehealth: Payer: Self-pay | Admitting: Certified Nurse Midwife

## 2024-05-26 NOTE — Telephone Encounter (Signed)
 Reached out to Candace Higgins to reschedule lab appt that was scheduled on 05/26/2024 at 2:20 per A. Sebastian.  Left message for Candace Higgins to call back.

## 2024-05-27 ENCOUNTER — Encounter: Payer: Self-pay | Admitting: Certified Nurse Midwife

## 2024-05-27 NOTE — Telephone Encounter (Signed)
 Reached out to pt (2x) to reschedule lab appt per Zelda.  Left message for pt to call back to reschedule.  Will send a MyChart letter to pt.

## 2024-06-07 ENCOUNTER — Ambulatory Visit: Admitting: Podiatry

## 2024-06-21 ENCOUNTER — Encounter: Payer: Self-pay | Admitting: Podiatry

## 2024-06-21 ENCOUNTER — Ambulatory Visit (INDEPENDENT_AMBULATORY_CARE_PROVIDER_SITE_OTHER): Admitting: Podiatry

## 2024-06-21 VITALS — Ht 66.0 in | Wt 181.1 lb

## 2024-06-21 DIAGNOSIS — L600 Ingrowing nail: Secondary | ICD-10-CM

## 2024-06-21 NOTE — Patient Instructions (Signed)

## 2024-06-21 NOTE — Progress Notes (Signed)
   Chief Complaint  Patient presents with   Nail Problem    Pt is here due to left 2nd toenail, nail is thick and painful, states she was seen here years ago and discussed taking the nail off, wants to know what should be done to the toenail.    HPI: 33 y.o. female presenting today for follow-up evaluation of nail dystrophy to the left second toe.  It continues to be very symptomatic and tender.  Brief history: several years ago she did drop a piece of furniture on her left second toe and that is when the nail became disfigured.  She is concerned for possible permanent nail damage versus fungal nail infection.  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    Past Surgical History:  Procedure Laterality Date   BREAST BIOPSY  09/02/2011   left   nail biopsy Left 03/14/2022   2nd toe   WISDOM TOOTH EXTRACTION  09/02/2007    No Known Allergies   Physical Exam: General: The patient is alert and oriented x3 in no acute distress.  Dermatology: Skin is warm, dry and supple bilateral lower extremities. Negative for open lesions or macerations.  Hyperkeratotic dystrophic nail noted to the left second toenail plate  Vascular: Palpable pedal pulses bilaterally. Capillary refill within normal limits.  Negative for any significant edema or erythema  Neurological: Light touch and protective threshold grossly intact  Musculoskeletal Exam: No pedal deformities noted   Assessment: 1.  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  Nail biopsy was reviewed negative for fungus -Today we discussed different treatment options including simple debridement and routine nail care versus total permanent nail avulsion of the nail plate.  After discussion with the patient she would like to have the nail permanently avulsed.  I do believe this would be in the  patient's best interest -The toe was prepped in aseptic manner and digital block performed using 3 mL of 2% lidocaine  plain.  The nail was avulsed in its entirety followed by 3 x 30-second application of phenol and an alcohol flush -Dressings applied.  Post care instructions provided -Return to clinic 3 weeks  *Works at [employer]     Thresa EMERSON Sar, DPM [facility]  Dr. Thresa EMERSON Sar, DPM    [address] [place], [place] [address]                Office [phone]  Fax [phone]

## 2024-06-24 ENCOUNTER — Ambulatory Visit: Admitting: Podiatry

## 2024-07-05 ENCOUNTER — Encounter: Payer: Self-pay | Admitting: Podiatry

## 2024-07-07 ENCOUNTER — Ambulatory Visit

## 2024-07-12 ENCOUNTER — Ambulatory Visit: Admitting: Podiatry

## 2024-07-19 ENCOUNTER — Ambulatory Visit: Admitting: Podiatry
